# Patient Record
Sex: Male | Born: 1970
Health system: Southern US, Community
[De-identification: ages and names within clinical notes are randomized; demographics above are authoritative.]

## PROBLEM LIST (undated history)

## (undated) DIAGNOSIS — E119 Type 2 diabetes mellitus without complications: Secondary | ICD-10-CM

## (undated) DIAGNOSIS — I10 Essential (primary) hypertension: Secondary | ICD-10-CM

## (undated) DIAGNOSIS — M1990 Unspecified osteoarthritis, unspecified site: Secondary | ICD-10-CM

## (undated) HISTORY — DX: Unspecified osteoarthritis, unspecified site: M19.90

## (undated) HISTORY — PX: NO PAST SURGERIES: SHX2092

---

## 2001-03-10 ENCOUNTER — Emergency Department (HOSPITAL_COMMUNITY): Admission: EM | Admit: 2001-03-10 | Discharge: 2001-03-10 | Payer: Self-pay | Admitting: Emergency Medicine

## 2001-03-10 ENCOUNTER — Encounter: Payer: Self-pay | Admitting: Emergency Medicine

## 2001-07-12 ENCOUNTER — Emergency Department (HOSPITAL_COMMUNITY): Admission: EM | Admit: 2001-07-12 | Discharge: 2001-07-12 | Payer: Self-pay | Admitting: Emergency Medicine

## 2001-07-12 ENCOUNTER — Encounter: Payer: Self-pay | Admitting: Emergency Medicine

## 2001-07-19 ENCOUNTER — Emergency Department (HOSPITAL_COMMUNITY): Admission: EM | Admit: 2001-07-19 | Discharge: 2001-07-19 | Payer: Self-pay | Admitting: Emergency Medicine

## 2003-03-04 ENCOUNTER — Emergency Department (HOSPITAL_COMMUNITY): Admission: EM | Admit: 2003-03-04 | Discharge: 2003-03-04 | Payer: Self-pay | Admitting: Emergency Medicine

## 2005-04-26 ENCOUNTER — Emergency Department (HOSPITAL_COMMUNITY): Admission: EM | Admit: 2005-04-26 | Discharge: 2005-04-26 | Payer: Self-pay | Admitting: Emergency Medicine

## 2009-10-13 ENCOUNTER — Emergency Department (HOSPITAL_COMMUNITY): Admission: EM | Admit: 2009-10-13 | Discharge: 2009-10-13 | Payer: Self-pay | Admitting: Emergency Medicine

## 2012-03-13 ENCOUNTER — Emergency Department (HOSPITAL_COMMUNITY)
Admission: EM | Admit: 2012-03-13 | Discharge: 2012-03-13 | Disposition: A | Discharge status: Home / Self Care | Payer: Self-pay | Attending: Emergency Medicine | Admitting: Emergency Medicine

## 2012-03-13 ENCOUNTER — Encounter (HOSPITAL_COMMUNITY): Payer: Self-pay | Admitting: Emergency Medicine

## 2012-03-13 DIAGNOSIS — S01511D Laceration without foreign body of lip, subsequent encounter: Secondary | ICD-10-CM

## 2012-03-13 DIAGNOSIS — T07XXXA Unspecified multiple injuries, initial encounter: Secondary | ICD-10-CM

## 2012-03-13 DIAGNOSIS — Y92410 Unspecified street and highway as the place of occurrence of the external cause: Secondary | ICD-10-CM | POA: Insufficient documentation

## 2012-03-13 DIAGNOSIS — F17200 Nicotine dependence, unspecified, uncomplicated: Secondary | ICD-10-CM | POA: Insufficient documentation

## 2012-03-13 DIAGNOSIS — S01501A Unspecified open wound of lip, initial encounter: Secondary | ICD-10-CM | POA: Insufficient documentation

## 2012-03-13 HISTORY — DX: Essential (primary) hypertension: I10

## 2012-03-13 MED ORDER — TETANUS-DIPHTH-ACELL PERTUSSIS 5-2.5-18.5 LF-MCG/0.5 IM SUSP
0.5000 mL | Freq: Once | INTRAMUSCULAR | Status: AC
  Administered 2012-03-13: 0.5 mL via INTRAMUSCULAR
  Filled 2012-03-13: qty 0.5

## 2012-03-13 MED ORDER — IBUPROFEN 800 MG PO TABS: 800.0000 mg | ORAL_TABLET | Freq: Three times a day (TID) | ORAL | Status: AC | PRN

## 2012-03-13 NOTE — ED Notes (Signed)
Pt was given [facility]. Pt must find PCP due to intermittent hypertension
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
## 2012-03-13 NOTE — Discharge Instructions (Signed)
Return here as needed. Keep the area on your lip clean. Ice on your lip as well.
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
## 2012-03-13 NOTE — ED Provider Notes (Signed)
Medical screening examination/treatment/procedure(s) were performed by non-physician practitioner and as supervising physician I was immediately available for consultation/collaboration.   Joel Mericle M Rudi Bunyard, MD 03/13/12 1700 

## 2012-06-27 ENCOUNTER — Emergency Department (HOSPITAL_COMMUNITY): Payer: Self-pay

## 2012-06-27 ENCOUNTER — Encounter (HOSPITAL_COMMUNITY): Payer: Self-pay

## 2012-06-27 ENCOUNTER — Emergency Department (HOSPITAL_COMMUNITY)
Admission: EM | Admit: 2012-06-27 | Discharge: 2012-06-27 | Disposition: A | Discharge status: Home / Self Care | Payer: Self-pay | Attending: Emergency Medicine | Admitting: Emergency Medicine

## 2012-06-27 DIAGNOSIS — R079 Chest pain, unspecified: Secondary | ICD-10-CM | POA: Insufficient documentation

## 2012-06-27 DIAGNOSIS — I10 Essential (primary) hypertension: Secondary | ICD-10-CM | POA: Insufficient documentation

## 2012-06-27 DIAGNOSIS — R0602 Shortness of breath: Secondary | ICD-10-CM | POA: Insufficient documentation

## 2012-06-27 LAB — CBC
HCT: 45.4 % (ref 39.0–52.0)
Hemoglobin: 15.1 g/dL (ref 13.0–17.0)
MCH: 28.9 pg (ref 26.0–34.0)
MCHC: 33.3 g/dL (ref 30.0–36.0)

## 2012-06-27 LAB — BASIC METABOLIC PANEL
BUN: 8 mg/dL (ref 6–23)
GFR calc non Af Amer: >90 mL/min (ref >90)
Glucose, Bld: 127 mg/dL — ABNORMAL HIGH (ref 70–99)
Potassium: 4 mEq/L (ref 3.5–5.1)

## 2012-06-27 LAB — TROPONIN I
Troponin I: <0.3 ng/mL (ref <0.30)
Troponin I: <0.3 ng/mL (ref <0.30)

## 2012-06-27 MED ORDER — ALBUTEROL SULFATE HFA 108 (90 BASE) MCG/ACT IN AERS
2.0000 | INHALATION_SPRAY | RESPIRATORY_TRACT | Status: DC | PRN
  Filled 2012-06-27: qty 6.7

## 2012-06-27 MED ORDER — MORPHINE SULFATE 4 MG/ML IJ SOLN
4.0000 mg | Freq: Once | INTRAMUSCULAR | Status: AC
  Administered 2012-06-27: 4 mg via INTRAVENOUS
  Filled 2012-06-27: qty 1

## 2012-06-27 NOTE — ED Notes (Signed)
MD at bedside. 

## 2012-06-27 NOTE — ED Notes (Signed)
Per EMS, pt started having chest pain about a week ago.  Pt is c/o mid-sternal chest pain with no radiation.  Pt rates pain 7/10, 'sharp pain'.

## 2012-06-27 NOTE — ED Provider Notes (Signed)
History     CSN: [id number]  Arrival date & time 06/27/12  1554   First MD Initiated Contact with Patient 06/27/12 1603      Chief Complaint  Patient presents with  . Chest Pain    (Consider location/radiation/quality/duration/timing/severity/associated sxs/prior treatment) Patient is a 41 y.o. male presenting with chest pain. The history is provided by the patient.  Chest Pain The chest pain began 5 - 7 days ago. Chest pain occurs constantly. The chest pain is unchanged. The severity of the pain is moderate. The quality of the pain is described as sharp. The pain does not radiate. Chest pain is worsened by certain positions. Primary symptoms include shortness of breath. Pertinent negatives for primary symptoms include no fever, no fatigue, no syncope, no cough, no wheezing, no palpitations, no abdominal pain, no nausea, no vomiting, no dizziness and no altered mental status. He tried nothing for the symptoms. Risk factors include male gender, obesity and smoking/tobacco exposure.  His past medical history is significant for hypertension.     Past Medical History  Diagnosis Date  . Hypertension     History reviewed. No pertinent past surgical history.  Family History  Problem Relation Age of Onset  . Diabetes Mother   . Hypertension Mother     History  Substance Use Topics  . Smoking status: Current Every Day Smoker    Types: Cigarettes  . Smokeless tobacco: Not on file  . Alcohol Use: Yes     socially      Review of Systems  Constitutional: Negative for fever and fatigue.  Respiratory: Positive for shortness of breath. Negative for cough and wheezing.   Cardiovascular: Positive for chest pain. Negative for palpitations and syncope.  Gastrointestinal: Negative for nausea, vomiting, abdominal pain and diarrhea.  Neurological: Negative for dizziness and headaches.  Psychiatric/Behavioral: Negative for altered mental status.  All other systems reviewed and are  negative.    Allergies  Review of patient's allergies indicates no known allergies.  Home Medications  No current outpatient prescriptions on file.  BP 145/72  Pulse 84  Temp 97.4 F (36.3 C) (Oral)  Resp 19  Ht 5\' 7"  (1.702 m)  Wt 207 lb (93.895 kg)  BMI 32.42 kg/m2  SpO2 97%  Physical Exam  Nursing note and vitals reviewed. Constitutional: He is oriented to person, place, and time. He appears well-developed and well-nourished. No distress.  HENT:  Head: Normocephalic and atraumatic.  Eyes: Pupils are equal, round, and reactive to light.  Cardiovascular: Normal rate, regular rhythm and normal heart sounds.  Exam reveals no gallop and no friction rub.   No murmur heard. Pulmonary/Chest: Effort normal and breath sounds normal. No respiratory distress.  Abdominal: Soft. He exhibits no distension. There is no tenderness.  Musculoskeletal: Normal range of motion.  Neurological: He is alert and oriented to person, place, and time.  Skin: Skin is warm and dry.  Psychiatric: He has a normal mood and affect.    ED Course  Procedures (including critical care time)  Labs Reviewed  BASIC METABOLIC PANEL - Abnormal; Notable for the following:    Glucose, Bld 127 (*)     All other components within normal limits  TROPONIN I  CBC  D-DIMER, QUANTITATIVE  SEDIMENTATION RATE  TROPONIN I   Dg Chest 2 View  06/27/2012  *RADIOLOGY REPORT*  Clinical Data: Chest pain.  Hypertension.  CHEST - 2 VIEW  Comparison:  None.  Findings:  The heart size and mediastinal contours are within  normal limits.  Both lungs are clear.  The visualized skeletal structures are unremarkable.  IMPRESSION: No active cardiopulmonary disease.   Original Report Authenticated By: Danae Orleans, M.D.      1. Chest pain       Date: 06/27/2012  Rate: 86  Rhythm: normal sinus rhythm  QRS Axis: normal  Intervals: normal  ST/T Wave abnormalities: diffuse T-wave inversion (I, II, aVF, V3-V6)  Conduction  Disutrbances:none  Narrative Interpretation: Diffuse non-specific T wave inversions  Old EKG Reviewed: none available    MDM  4:42 PM Pt seen and examined. Pt with about a week of sharp SS chest pain associated with SOB. Pt with HTN and is a smoker. Due to EKG, will get troponin. Pain does not sound ischemic as it is worst while sitting on the bed. Will also get D-dimer.    8:14 PM Have not yet been able to reach cardiology. Will send second troponin and ESR for possible pericarditis due to diffuse nature of T wave inversions. Labs unremarkable. Pain decreased with morphine.   10:15 PM Spoke to [facility] who will see patient in office tomorrow for new EKG findings. Second troponin also negative. Feel that as patient has mostly been constant for a week, doubt ischemic cause. Patient did get some relief with morphine. Will also send with inhaler due to smoking history.   Daleen Bo, MD 06/28/12 [phone]

## 2012-06-28 NOTE — ED Provider Notes (Signed)
I saw and evaluated the patient, reviewed the resident's note and I agree with the findings and plan. I evaluated the EKG and agree with the resident's interpretation. Patient is complaining of one-week consistent chest pain that is pleuritic in nature. He states he did have a URI and viral symptoms a week prior to symptoms starting. If any shortness of breath currently but states some of the chest pain is still present. His EKG shows diffuse T wave inversion with negative troponins x2, normal d-dimer and a normal chest x-ray. Patient is a smoker but otherwise has no medical history. Spoke with cardiology who will followup with the patient tomorrow as this could be pericarditis the patient also was given an inhaler with some improvement.  Gwyneth Sprout, MD 06/28/12 2326

## 2012-06-30 ENCOUNTER — Telehealth (HOSPITAL_COMMUNITY): Payer: Self-pay | Admitting: *Deleted

## 2012-06-30 ENCOUNTER — Inpatient Hospital Stay (HOSPITAL_COMMUNITY): Payer: Self-pay

## 2012-06-30 NOTE — Telephone Encounter (Signed)
Dr Gala Romney was consulted on pt when he was in the ER on 10/1 with chest pain and advised pt to f/u as out patient, pt was contacted by phone on 10/3 and appointment was scheduled for him to see Dr Gala Romney on 10/4 at 10 am he agreed to this time and was given directions to the clinic, however he did not show up for the appointment today

## 2012-12-02 ENCOUNTER — Emergency Department (HOSPITAL_COMMUNITY): Payer: Self-pay

## 2012-12-02 ENCOUNTER — Encounter (HOSPITAL_COMMUNITY): Payer: Self-pay | Admitting: Emergency Medicine

## 2012-12-02 ENCOUNTER — Emergency Department (HOSPITAL_COMMUNITY)
Admission: EM | Admit: 2012-12-02 | Discharge: 2012-12-03 | Disposition: A | Discharge status: Home / Self Care | Payer: Self-pay | Attending: Emergency Medicine | Admitting: Emergency Medicine

## 2012-12-02 DIAGNOSIS — R0789 Other chest pain: Secondary | ICD-10-CM | POA: Insufficient documentation

## 2012-12-02 DIAGNOSIS — F17200 Nicotine dependence, unspecified, uncomplicated: Secondary | ICD-10-CM | POA: Insufficient documentation

## 2012-12-02 DIAGNOSIS — I10 Essential (primary) hypertension: Secondary | ICD-10-CM | POA: Insufficient documentation

## 2012-12-02 NOTE — ED Notes (Signed)
Pt alert, arrives from home, c/o left sided chest pain, onset was today, denies onset, describes pain as sharp radiating to left arm, resp even unlabored, skin pwd

## 2012-12-03 LAB — CBC
HCT: 44.1 % (ref 39.0–52.0)
Hemoglobin: 14.7 g/dL (ref 13.0–17.0)
MCHC: 33.3 g/dL (ref 30.0–36.0)
RBC: 5 MIL/uL (ref 4.22–5.81)

## 2012-12-03 LAB — BASIC METABOLIC PANEL
BUN: 15 mg/dL (ref 6–23)
CO2: 25 mEq/L (ref 19–32)
Chloride: 105 mEq/L (ref 96–112)
GFR calc non Af Amer: >90 mL/min (ref >90)
Glucose, Bld: 108 mg/dL — ABNORMAL HIGH (ref 70–99)
Potassium: 3.9 mEq/L (ref 3.5–5.1)

## 2012-12-03 NOTE — ED Notes (Signed)
Attempted to start IV x 3 RNs. IV Team paged and is at the bedside.

## 2012-12-03 NOTE — ED Provider Notes (Addendum)
History     CSN: [id number]  Arrival date & time 12/02/12  2208   First MD Initiated Contact with Patient 12/03/12 [phone]      Chief Complaint  Patient presents with  . Chest Pain    (Consider location/radiation/quality/duration/timing/severity/associated sxs/prior treatment) HPI This is a 42 year old male with history of hypertension. He has been having chest pain since yesterday about noon. The pain is described as a sharp, well localized pain just above the left nipple. It only last for one to 2 seconds. It is moderate in intensity. There does not seem to be any triggering factors. He is also having some lesser discomfort in his left submaxillary area, worse with deep breathing or raising his left arm. He denies dyspnea, diaphoresis, nausea and vomiting.  Past Medical History  Diagnosis Date  . Hypertension     History reviewed. No pertinent past surgical history.  Family History  Problem Relation Age of Onset  . Diabetes Mother   . Hypertension Mother     History  Substance Use Topics  . Smoking status: Current Every Day Smoker    Types: Cigarettes  . Smokeless tobacco: Not on file  . Alcohol Use: Yes     Comment: socially      Review of Systems  All other systems reviewed and are negative.    Allergies  Review of patient's allergies indicates no known allergies.  Home Medications  No current outpatient prescriptions on file.  BP 135/80  Pulse 59  Temp(Src) 97.9 F (36.6 C) (Oral)  Resp 16  Wt 220 lb (99.791 kg)  BMI 34.45 kg/m2  SpO2 99%  Physical Exam General: Well-developed, well-nourished male in no acute distress; appearance consistent with age of record HENT: normocephalic, atraumatic Eyes: pupils equal round and reactive to light; extraocular muscles intact Neck: supple Heart: regular rate and rhythm; no murmurs, rubs or gallops Lungs: clear to auscultation bilaterally Chest: Nontender Abdomen: soft; nondistended Extremities: No  deformity; full range of motion; pulses normal Neurologic: Awake, alert and oriented; motor function intact in all extremities and symmetric; no facial droop Skin: Warm and dry Psychiatric: Normal mood and affect    ED Course  Procedures (including critical care time)     MDM   Nursing notes and vitals signs, including pulse oximetry, reviewed.  Summary of this visit's results, reviewed by myself:  Labs:  Results for orders placed during the hospital encounter of 12/02/12 (from the past 24 hour(s))  CBC     Status: None   Collection Time    12/02/12 11:55 PM      Result Value Range   WBC 5.3  4.0 - 10.5 K/uL   RBC 5.00  4.22 - 5.81 MIL/uL   Hemoglobin 14.7  13.0 - 17.0 g/dL   HCT 11.9  14.7 - 82.9 %   MCV 88.2  78.0 - 100.0 fL   MCH 29.4  26.0 - 34.0 pg   MCHC 33.3  30.0 - 36.0 g/dL   RDW 56.2  13.0 - 86.5 %   Platelets 247  150 - 400 K/uL  BASIC METABOLIC PANEL     Status: Abnormal   Collection Time    12/02/12 11:55 PM      Result Value Range   Sodium 139  135 - 145 mEq/L   Potassium 3.9  3.5 - 5.1 mEq/L   Chloride 105  96 - 112 mEq/L   CO2 25  19 - 32 mEq/L   Glucose, Bld 108 (*) 70 -  99 mg/dL   BUN 15  6 - 23 mg/dL   Creatinine, Ser 1.61  0.50 - 1.35 mg/dL   Calcium 8.5  8.4 - 09.6 mg/dL   GFR calc non Af Amer >90  >90 mL/min   GFR calc Af Amer >90  >90 mL/min  TROPONIN I     Status: None   Collection Time    12/02/12 11:55 PM      Result Value Range   Troponin I <0.30  <0.30 ng/mL  TROPONIN I     Status: None   Collection Time    12/03/12  6:02 AM      Result Value Range   Troponin I <0.30  <0.30 ng/mL    Imaging Studies: Dg Chest 2 View  12/02/2012  *RADIOLOGY REPORT*  Clinical Data: Left mid chest pain, radiating to the left axilla. History of smoking.  CHEST - 2 VIEW  Comparison: Chest radiograph performed 06/27/2012  Findings: The lungs are well-aerated and clear.  There is no evidence of focal opacification, pleural effusion or pneumothorax.  The  heart is normal in size; the mediastinal contour is within normal limits.  No acute osseous abnormalities are seen.  IMPRESSION: No acute cardiopulmonary process seen.   Original Report Authenticated By: Tonia Ghent, M.D.    EKG Interpretation:  Date & Time: 12/02/2012 10:19 PM  Rate: 73  Rhythm: normal sinus rhythm  QRS Axis: normal  Intervals: normal  ST/T Wave abnormalities: Diffuse T-wave inversions  Conduction Disutrbances:none  Narrative Interpretation: Poor R-wave progression  Old EKG Reviewed: Unchanged from 06/27/2012  6:47 AM The patient's pain is atypical for coronary pain is only lasts for several seconds and is sharp. The pain under his left axilla is consistent with chest wall pain due to its variability with deep breathing and movement of the left arm.          Hanley Seamen, MD 12/03/12 0454  Hanley Seamen, MD 12/03/12 [phone]

## 2013-05-07 ENCOUNTER — Encounter (HOSPITAL_COMMUNITY): Payer: Self-pay | Admitting: Emergency Medicine

## 2013-05-07 ENCOUNTER — Emergency Department (HOSPITAL_COMMUNITY)
Admission: EM | Admit: 2013-05-07 | Discharge: 2013-05-07 | Disposition: A | Discharge status: Home / Self Care | Payer: Self-pay | Attending: Emergency Medicine | Admitting: Emergency Medicine

## 2013-05-07 DIAGNOSIS — I10 Essential (primary) hypertension: Secondary | ICD-10-CM | POA: Insufficient documentation

## 2013-05-07 DIAGNOSIS — Z0000 Encounter for general adult medical examination without abnormal findings: Secondary | ICD-10-CM

## 2013-05-07 DIAGNOSIS — Z711 Person with feared health complaint in whom no diagnosis is made: Secondary | ICD-10-CM | POA: Insufficient documentation

## 2013-05-07 DIAGNOSIS — F17200 Nicotine dependence, unspecified, uncomplicated: Secondary | ICD-10-CM | POA: Insufficient documentation

## 2013-05-07 LAB — URINALYSIS, ROUTINE W REFLEX MICROSCOPIC
Glucose, UA: NEGATIVE mg/dL
Leukocytes, UA: NEGATIVE
Nitrite: NEGATIVE
Specific Gravity, Urine: 1.023 (ref 1.005–1.030)
pH: 6.5 (ref 5.0–8.0)

## 2013-05-07 NOTE — ED Notes (Signed)
Pt states that his urine has been cloudy and with some blood this am, denies any pain. Some intermit lower back pain.

## 2013-05-07 NOTE — Progress Notes (Signed)
[facility] provided patient with a [facility] application.  °

## 2013-05-07 NOTE — ED Provider Notes (Signed)
Medical screening examination/treatment/procedure(s) were performed by non-physician practitioner and as supervising physician I was immediately available for consultation/collaboration.   Gwyneth Sprout, MD 05/07/13 2154

## 2013-05-07 NOTE — ED Provider Notes (Signed)
  CSN: [id number]     Arrival date & time 05/07/13  1324 History     First MD Initiated Contact with Patient 05/07/13 [phone]     Chief Complaint  Patient presents with  . Hematuria   (Consider location/radiation/quality/duration/timing/severity/associated sxs/prior Treatment) HPI  Robert Pratt is a 42 y.o. male with past medical history significant for hypertension and he is a daily smoker complaining of 2 episodes of painless hematuria this a.m. No history of kidney stones, patient denies any dysuria, urinary frequency, testicular pain swelling, lesions, abdominal pain, denies easy bleeding/bruising, weight loss, night sweats. Patient had similar prior episode several years ago. Patient states the bloody so this morning is now resolved.  Past Medical History  Diagnosis Date  . Hypertension    History reviewed. No pertinent past surgical history. Family History  Problem Relation Age of Onset  . Diabetes Mother   . Hypertension Mother    History  Substance Use Topics  . Smoking status: Current Every Day Smoker    Types: Cigarettes  . Smokeless tobacco: Not on file  . Alcohol Use: Yes     Comment: socially    Review of Systems 10 systems reviewed and found to be negative, except as noted in the HPI   Allergies  Review of patient's allergies indicates no known allergies.  Home Medications  No current outpatient prescriptions on file. BP 159/83  Pulse 66  Temp(Src) 98 F (36.7 C)  Resp 20  SpO2 99% Physical Exam  Nursing note and vitals reviewed. Constitutional: He is oriented to person, place, and time. He appears well-developed and well-nourished. No distress.  HENT:  Head: Normocephalic.  Eyes: Conjunctivae and EOM are normal.  Cardiovascular: Normal rate and intact distal pulses.   Pulmonary/Chest: Effort normal and breath sounds normal. No stridor.  Abdominal: Soft. Bowel sounds are normal. He exhibits no mass. There is no tenderness. There is no rebound and no  guarding.  Genitourinary:  No CVA tenderness bilaterally  Musculoskeletal: Normal range of motion.  Neurological: He is alert and oriented to person, place, and time.  Psychiatric: He has a normal mood and affect.    ED Course   Procedures (including critical care time)  Labs Reviewed  URINALYSIS, ROUTINE W REFLEX MICROSCOPIC   No results found. 1. Normal physical exam     MDM   Filed Vitals:   05/07/13 0941  BP: 159/83  Pulse: 66  Temp: 98 F (36.7 C)  Resp: 20  SpO2: 99%     Robert Pratt is a 42 y.o. male with 2 episodes of painless hematuria this a.m., now resolved. Urinalysis today shows no abnormalities. I have asked the patient to followup for a recheck before every meal there at [facility] urgent care or in the ED in the next 2-4 weeks. Return cautions discussed.  Pt is hemodynamically stable, appropriate for, and amenable to discharge at this time. Pt verbalized understanding and agrees with care plan. All questions answered. Outpatient follow-up and specific return precautions discussed.    Note: Portions of this report may have been transcribed using voice recognition software. Every effort was made to ensure accuracy; however, inadvertent computerized transcription errors may be present    Wynetta Emery, PA-C 05/07/13 1024

## 2013-08-31 ENCOUNTER — Emergency Department (HOSPITAL_COMMUNITY)
Admission: EM | Admit: 2013-08-31 | Discharge: 2013-08-31 | Disposition: A | Discharge status: Home / Self Care | Payer: Self-pay | Attending: Emergency Medicine | Admitting: Emergency Medicine

## 2013-08-31 ENCOUNTER — Encounter (HOSPITAL_COMMUNITY): Payer: Self-pay | Admitting: Emergency Medicine

## 2013-08-31 ENCOUNTER — Emergency Department (HOSPITAL_COMMUNITY): Payer: Self-pay

## 2013-08-31 DIAGNOSIS — M25569 Pain in unspecified knee: Secondary | ICD-10-CM | POA: Insufficient documentation

## 2013-08-31 DIAGNOSIS — M25469 Effusion, unspecified knee: Secondary | ICD-10-CM | POA: Insufficient documentation

## 2013-08-31 DIAGNOSIS — I10 Essential (primary) hypertension: Secondary | ICD-10-CM | POA: Insufficient documentation

## 2013-08-31 DIAGNOSIS — M13161 Monoarthritis, not elsewhere classified, right knee: Secondary | ICD-10-CM

## 2013-08-31 DIAGNOSIS — Z791 Long term (current) use of non-steroidal anti-inflammatories (NSAID): Secondary | ICD-10-CM | POA: Insufficient documentation

## 2013-08-31 DIAGNOSIS — F17200 Nicotine dependence, unspecified, uncomplicated: Secondary | ICD-10-CM | POA: Insufficient documentation

## 2013-08-31 LAB — SYNOVIAL CELL COUNT + DIFF, W/ CRYSTALS
Neutrophil, Synovial: 27 % — ABNORMAL HIGH (ref 0–25)
WBC, Synovial: 839 /mm3 — ABNORMAL HIGH (ref 0–200)

## 2013-08-31 LAB — GRAM STAIN

## 2013-08-31 LAB — GLUCOSE, SYNOVIAL FLUID: Glucose, Synovial Fluid: 100 mg/dL

## 2013-08-31 MED ORDER — LIDOCAINE HCL 2 % IJ SOLN
INTRAMUSCULAR | Status: AC
  Filled 2013-08-31: qty 20

## 2013-08-31 MED ORDER — OXYCODONE-ACETAMINOPHEN 5-325 MG PO TABS
1.0000 | ORAL_TABLET | Freq: Once | ORAL | Status: DC
  Filled 2013-08-31: qty 1

## 2013-08-31 MED ORDER — NAPROXEN 500 MG PO TABS: 500.0000 mg | ORAL_TABLET | Freq: Two times a day (BID) | ORAL | Status: DC

## 2013-08-31 NOTE — ED Notes (Addendum)
Pt c/o increasing R upper leg pain and swelling x days.  Pain score 10/10.  Denies injury.  No redness or warmth noted.  Pt ambulated into department w/o difficulty.

## 2013-08-31 NOTE — Progress Notes (Signed)
[facility] provided pt with a list of primary care resources. Patient stated that he was pending insurance through job.  °

## 2013-08-31 NOTE — ED Provider Notes (Signed)
CSN: [id number]     Arrival date & time 08/31/13  3086 History   First MD Initiated Contact with Patient 08/31/13 0932     Chief Complaint  Patient presents with  . Leg Pain  . Leg Swelling   (Consider location/radiation/quality/duration/timing/severity/associated sxs/prior Treatment) The history is provided by the patient. No language interpreter was used.  Robert Pratt is a 42 y/o M with PMHx of HTN presenting to the ED with right knee pain and swelling that has been ongoing since Monday that has gradually gotten worse. Patient reported that he has noticed swelling to the right knee that has gotten gradually worse. Patient reported that when he is walking and applying pressure to the leg there is a sharp, shooting pain. Patient reported that while at rest there is a tingling sensation. Patient reported that the pain radiates up right thigh and down to the mid-calf. Patient reported that nothing makes the pain better. Patient reported that he has not used anything for pain. Denied falls, injury, numbness, tingling, loss of sensation, weakness, chills, fever. Patient denied history of DM and STDs.  PCP none   Past Medical History  Diagnosis Date  . Hypertension    History reviewed. No pertinent past surgical history. Family History  Problem Relation Age of Onset  . Diabetes Mother   . Hypertension Mother    History  Substance Use Topics  . Smoking status: Current Every Day Smoker -- 0.50 packs/day    Types: Cigarettes  . Smokeless tobacco: Never Used  . Alcohol Use: No    Review of Systems  Constitutional: Negative for fever and chills.  Musculoskeletal: Positive for arthralgias (right knee).  Skin: Negative for color change and rash.  Neurological: Negative for weakness and numbness.  All other systems reviewed and are negative.    Allergies  Review of patient's allergies indicates no known allergies.  Home Medications   Current Outpatient Rx  Name  Route  Sig   Dispense  Refill  . naproxen (NAPROSYN) 500 MG tablet   Oral   Take 1 tablet (500 mg total) by mouth 2 (two) times daily.   30 tablet   0    BP 144/95  Pulse 67  Temp(Src) 97.7 F (36.5 C) (Oral)  Resp 12  SpO2 100% Physical Exam  Nursing note and vitals reviewed. Constitutional: He is oriented to person, place, and time. He appears well-developed and well-nourished. No distress.  HENT:  Head: Normocephalic and atraumatic.  Eyes: Conjunctivae and EOM are normal. Pupils are equal, round, and reactive to light. Right eye exhibits no discharge. Left eye exhibits no discharge.  Neck: Normal range of motion. Neck supple. No tracheal deviation present.  Cardiovascular: Normal rate, regular rhythm and normal heart sounds.  Exam reveals no friction rub.   No murmur heard. Pulses:      Radial pulses are 2+ on the right side, and 2+ on the left side.       Dorsalis pedis pulses are 2+ on the right side, and 2+ on the left side.  Pulmonary/Chest: Effort normal and breath sounds normal. No respiratory distress. He has no wheezes. He has no rales.  Musculoskeletal: He exhibits tenderness.  Moderate swelling localized to the right knee with mild warmth upon palpation. Mild decrease in flexion and extension of the right knee secondary to pain - but, motion noted to the right knee. Discomfort upon palpation to the right knee, circumferential.  Full ROM to right ankle, right hip, digits of the  right foot, and LLE.   Lymphadenopathy:    He has no cervical adenopathy.  Neurological: He is alert and oriented to person, place, and time. He exhibits normal muscle tone. Coordination normal.  Strength 5+/5+ to lower extremities bilaterally with resistance applied, equal distribution noted Sensation intact with differentiation to sharp and dull touch to BLE  Skin: Skin is warm and dry. He is not diaphoretic. No erythema.  Negative erythema and inflammation noted.  Negative lesions noted to the palms of  the hands and soles of the feet.   Psychiatric: He has a normal mood and affect. His behavior is normal. Thought content normal.    ED Course  Procedures (including critical care time)  11:29 AM This provider assisted in tapping the right knee with Dr. Cheri Rous. Approximately 100-105 cc of fluid withdrawn from the right knee. Improved mobility of the right knee noted. Pressure dressing applied. Body fluid labs sent off and pending.   Results for orders placed during the hospital encounter of 08/31/13  GRAM STAIN      Result Value Range   Specimen Description SYNOVIAL RIGHT KNEE     Special Requests Normal     Gram Stain       Value: RARE WBC PRESENT, PREDOMINANTLY PMN     NO ORGANISMS SEEN     Gram Stain Report Called to,Read Back By and Verified With: CARNEAL,C. AT 1330 ON 12.05.14 BY LOVE,T.   Report Status 08/31/2013 FINAL    SYNOVIAL CELL COUNT + DIFF, W/ CRYSTALS      Result Value Range   Color, Synovial YELLOW  YELLOW   Appearance-Synovial CLOUDY (*) CLEAR   Crystals, Fluid NO CRYSTALS SEEN     WBC, Synovial 839 (*) 0 - 200 /cu mm   Neutrophil, Synovial 27 (*) 0 - 25 %   Lymphocytes-Synovial Fld 71 (*) 0 - 20 %   Monocyte-Macrophage-Synovial Fluid 2 (*) 50 - 90 %     Dg Knee Complete 4 Views Right  08/31/2013   CLINICAL DATA:  right lateral knee pain.  EXAM: RIGHT KNEE - COMPLETE 4+ VIEW  COMPARISON:  None.  FINDINGS: Normal alignment without acute fracture. No significant arthropathy. Bipartite patella noted. Soft tissue swelling evident with a large joint effusion on the lateral view.  IMPRESSION: Soft tissue swelling with a large right knee joint effusion  No acute osseous finding  Bipartite patellar configuration.   Electronically Signed   By: Ruel Favors M.D.   On: 08/31/2013 10:33   Labs Review Labs Reviewed  SYNOVIAL CELL COUNT + DIFF, W/ CRYSTALS - Abnormal; Notable for the following:    Appearance-Synovial CLOUDY (*)    WBC, Synovial 839 (*)    Neutrophil,  Synovial 27 (*)    Lymphocytes-Synovial Fld 71 (*)    Monocyte-Macrophage-Synovial Fluid 2 (*)    All other components within normal limits  GRAM STAIN  PROTEIN, BODY FLUID  GLUCOSE, SYNOVIAL FLUID  BODY FLUID CRYSTAL   Imaging Review Dg Knee Complete 4 Views Right  08/31/2013   CLINICAL DATA:  right lateral knee pain.  EXAM: RIGHT KNEE - COMPLETE 4+ VIEW  COMPARISON:  None.  FINDINGS: Normal alignment without acute fracture. No significant arthropathy. Bipartite patella noted. Soft tissue swelling evident with a large joint effusion on the lateral view.  IMPRESSION: Soft tissue swelling with a large right knee joint effusion  No acute osseous finding  Bipartite patellar configuration.   Electronically Signed   By: Sharen Counter.D.  On: 08/31/2013 10:33    EKG Interpretation   None       MDM   1. Inflammation of joint of knee, right     Medications  lidocaine (XYLOCAINE) 2 % (with pres) injection (not administered)  oxyCODONE-acetaminophen (PERCOCET/ROXICET) 5-325 MG per tablet 1 tablet (1 tablet Oral Not Given 08/31/13 1313)   Filed Vitals:   08/31/13 0830 08/31/13 1311  BP: 169/88 144/95  Pulse: 76 67  Temp: 98.1 F (36.7 C) 97.7 F (36.5 C)  TempSrc: Oral Oral  Resp: 18 12  SpO2: 98% 100%     Patient presenting to the ED with right knee swelling and pain that has been ongoing since Monday.  Alert and oriented. GCS 15. Heart rate and rhythm normal. Pulses palpable and strong, radial and DP 2+ bilaterally. Moderate swelling localized to the right knee with mild warmth upon palpation, decreased flexion and extension noted secondary to pain. Circumferential discomfort noted upon palpation. Strength intact to BLE with resistance applied, equal distribution noted.  Right knee plain film performed with soft tissue swelling noted with large right knee joint effusion identified. This provider and Dr. Cheri Rous performed joint tap and sent out for analysis the specimens of  synovial fluid. Synovial fluid noted to be cloudy in appearance with mild elevated WBC of 839, mild elevated neutrophils and lymphocytes of 71. Negative findings on the gram stain. Protein and glucose pending. Discussed case and reviewed labs and synovial fluid findings with Dr. Cheri Rous - as per physician reported that this is more of an inflammatory process, recommended patient be discharged with anti-inflammatories and follow-up.  Doubt septic doubt. Negative crystals found - doubt gout. Suspicion to be possible bursitis leading to inflammatory process. Patient stable, afebrile. Patient does not appear toxic. Discharged patient. Discharged patient with anti-inflammatories. Referred patient to orthopedics. Discussed with patient to rest, ice, elevate. Discharged patient with knee sleeve. Work note given. Discussed with patient to continue to monitor symptoms closely and if symptoms are to worsen or change to report back to the ED - strict return instructions given.  Patient agreed to plan of care, understood, all questions answered.   Raymon Mutton, PA-C 08/31/13 [phone]

## 2013-09-03 NOTE — ED Provider Notes (Signed)
Medical screening examination/treatment/procedure(s) were conducted as a shared visit with non-physician practitioner(s) and myself.  I personally evaluated the patient during the encounter.  EKG Interpretation   None       Robert Pratt is a 42 y.o. male hx of HTN here with R knee pain and swelling. He is on his feet a lot and bends down a lot at work. Knee swelling for several days. Obvious effusion on exam. No other signs of septic knee. No history of gout. Xray showed large effusion. I performed arthrocentesis and drawn out 100 cc fluid. Compression dressing placed. Fluid showed small number of WBC and no crystals. I think he likely has joint effusion and I doubt any infection or gout. Will d/c home on anti inflammatories.   ARTHOCENTESIS Performed by: Chaney Malling Consent: Verbal consent obtained. Risks and benefits: risks, benefits and alternatives were discussed Consent given by: patient Required items: required blood products, implants, devices, and special equipment available Patient identity confirmed: verbally with patient Time out: Immediately prior to procedure a "time out" was called to verify the correct patient, procedure, equipment, support staff and site/side marked as required. Indications: R knee swelling Joint: R knee Local anesthesia used: 5 cc 2% lido with no epi Preparation: Patient was prepped and draped in the usual sterile fashion. Aspirate appearance: clear Aspirate amount: 100 ml Patient tolerance: Patient tolerated the procedure well with no immediate complications. Compression dressing placed.       Richardean Canal, MD 09/03/13 [phone]

## 2013-11-03 ENCOUNTER — Emergency Department (HOSPITAL_COMMUNITY)
Admission: EM | Admit: 2013-11-03 | Discharge: 2013-11-03 | Disposition: A | Discharge status: Home / Self Care | Payer: Self-pay | Attending: Emergency Medicine | Admitting: Emergency Medicine

## 2013-11-03 DIAGNOSIS — I10 Essential (primary) hypertension: Secondary | ICD-10-CM | POA: Insufficient documentation

## 2013-11-03 DIAGNOSIS — Z9119 Patient's noncompliance with other medical treatment and regimen: Secondary | ICD-10-CM | POA: Insufficient documentation

## 2013-11-03 DIAGNOSIS — F17200 Nicotine dependence, unspecified, uncomplicated: Secondary | ICD-10-CM | POA: Insufficient documentation

## 2013-11-03 DIAGNOSIS — R202 Paresthesia of skin: Secondary | ICD-10-CM

## 2013-11-03 DIAGNOSIS — Z91199 Patient's noncompliance with other medical treatment and regimen due to unspecified reason: Secondary | ICD-10-CM | POA: Insufficient documentation

## 2013-11-03 DIAGNOSIS — R209 Unspecified disturbances of skin sensation: Secondary | ICD-10-CM | POA: Insufficient documentation

## 2013-11-03 LAB — POCT I-STAT TROPONIN I: Troponin i, poc: 0 ng/mL (ref 0.00–0.08)

## 2013-11-03 LAB — POCT I-STAT, CHEM 8
BUN: 21 mg/dL (ref 6–23)
CALCIUM ION: 1.21 mmol/L (ref 1.12–1.23)
Chloride: 104 mEq/L (ref 96–112)
Creatinine, Ser: 0.8 mg/dL (ref 0.50–1.35)
Glucose, Bld: 100 mg/dL — ABNORMAL HIGH (ref 70–99)
HEMATOCRIT: 51 % (ref 39.0–52.0)
Hemoglobin: 17.3 g/dL — ABNORMAL HIGH (ref 13.0–17.0)
POTASSIUM: 3.9 meq/L (ref 3.7–5.3)
Sodium: 142 mEq/L (ref 137–147)
TCO2: 24 mmol/L (ref 0–100)

## 2013-11-03 MED ORDER — ENALAPRIL-HYDROCHLOROTHIAZIDE 5-12.5 MG PO TABS: 1.0000 | ORAL_TABLET | Freq: Every day | ORAL | Status: DC

## 2013-11-03 NOTE — ED Notes (Signed)
EDP at the bedside.  ?

## 2013-11-03 NOTE — Discharge Instructions (Signed)
Hypertension Call the wellness Center or any of the numbers on the resource guide to get a primary care physician. Ask your new primary care physician to help you to stop smoking. Call in 2 days to get the next available appointment. Your blood pressure should be rechecked within a week. You can go to urgent care Center to get your blood pressure rechecked. Start taking blood pressure medication prescribed today. Hypertension is another name for high blood pressure. High blood pressure may mean that your heart needs to work harder to pump blood. Blood pressure consists of two numbers, which includes a higher number over a lower number (example: 110/72). HOME CARE   Make lifestyle changes as told by your doctor. This may include weight loss and exercise.  Take your blood pressure medicine every day.  Limit how much salt you use.  Stop smoking if you smoke.  Do not use drugs.  Talk to your doctor if you are using decongestants or birth control pills. These medicines might make blood pressure higher.  Females should not drink more than 1 alcoholic drink per day. Males should not drink more than 2 alcoholic drinks per day.  See your doctor as told. GET HELP RIGHT AWAY IF:   You have a blood pressure reading with a top number of 180 or higher.  You get a very bad headache.  You get blurred or changing vision.  You feel confused.  You feel weak, numb, or faint.  You get chest or belly (abdominal) pain.  You throw up (vomit).  You cannot breathe very well. MAKE SURE YOU:   Understand these instructions.  Will watch your condition.  Will get help right away if you are not doing well or get worse. Document Released: 03/01/2008 Document Revised: 12/06/2011 Document Reviewed: 03/01/2008 [facility] Patient Information 2014 [place], [place].

## 2013-11-03 NOTE — ED Notes (Signed)
Pt. Stated, i've had some left arm numbness for 4 months but more numbness this morning.  Taken BP med. in the past  But quick taken it.

## 2013-11-03 NOTE — ED Notes (Signed)
MD at bedside. 

## 2013-11-03 NOTE — ED Provider Notes (Signed)
CSN: [id number]     Arrival date & time 11/03/13  0757 History   First MD Initiated Contact with Patient 11/03/13 [phone]     Chief Complaint  Patient presents with  . Numbness   (Consider location/radiation/quality/duration/timing/severity/associated sxs/prior Treatment) HPI Complains of left arm numbness for the past 3 months. Left arm numbness to include entire forearm and entire hand. Symptoms became worse today at 3 AM. Symptoms are not made worse by exertion He denies any chest pain no shortness of breath no nausea no headache no neck pain no difficulty speaking no loss of strength. Symptoms are improved by raising his arm above his head and by putting his fist no difficulty with gait. Nothing makes symptoms worse.. patient has been noncompliant with blood pressure medication since 2008 Past Medical History  Diagnosis Date  . Hypertension    No past surgical history on file. Family History  Problem Relation Age of Onset  . Diabetes Mother   . Hypertension Mother    History  Substance Use Topics  . Smoking status: Current Every Day Smoker -- 0.50 packs/day    Types: Cigarettes  . Smokeless tobacco: Never Used  . Alcohol Use: No    Review of Systems  Constitutional: Negative.   HENT: Negative.   Respiratory: Negative.   Cardiovascular: Negative.   Gastrointestinal: Negative.   Musculoskeletal: Negative.   Skin: Negative.   Neurological: Positive for numbness.  Psychiatric/Behavioral: Negative.   All other systems reviewed and are negative.    Allergies  Review of patient's allergies indicates no known allergies.  Home Medications  No current outpatient prescriptions on file. BP 167/111  Pulse 64  Temp(Src) 97.4 F (36.3 C) (Oral)  Resp 20  SpO2 99% Physical Exam  Nursing note and vitals reviewed. Constitutional: He is oriented to person, place, and time. He appears well-developed and well-nourished.  HENT:  Head: Normocephalic and atraumatic.  Eyes:  Conjunctivae are normal. Pupils are equal, round, and reactive to light.  Neck: Neck supple. No tracheal deviation present. No thyromegaly present.  Cardiovascular: Normal rate and regular rhythm.   No murmur heard. Pulmonary/Chest: Effort normal and breath sounds normal.  Abdominal: Soft. Bowel sounds are normal. He exhibits no distension. There is no tenderness.  Musculoskeletal: Normal range of motion. He exhibits no edema and no tenderness.  Neurological: He is alert and oriented to person, place, and time. He has normal reflexes. He displays normal reflexes. No cranial nerve deficit. He exhibits normal muscle tone. Coordination normal.  Gait normal motor strength 5 over 5 overall  Skin: Skin is warm and dry. No rash noted.  Psychiatric: He has a normal mood and affect.    ED Course  Procedures (including critical care time) Labs Review Labs Reviewed - No data to display Imaging Review No results found.  EKG Interpretation    Date/Time:  Saturday November 03 2013 08:02:25 EST Ventricular Rate:  62 PR Interval:  192 QRS Duration: 86 QT Interval:  406 QTC Calculation: 412 R Axis:   71 Text Interpretation:  Normal sinus rhythm with sinus arrhythmia Anterior infarct , age undetermined T wave abnormality, consider inferolateral ischemia Abnormal ECG No significant change since last tracing Confirmed by Winfred Leeds  MD, Suan Pyeatt ([id number]) on 11/03/2013 8:10:13 AM           Results for orders placed during the hospital encounter of 11/03/13  POCT I-STAT, CHEM 8      Result Value Range   Sodium 142  137 - 147 mEq/L  Potassium 3.9  3.7 - 5.3 mEq/L   Chloride 104  96 - 112 mEq/L   BUN 21  6 - 23 mg/dL   Creatinine, Ser 0.80  0.50 - 1.35 mg/dL   Glucose, Bld 100 (*) 70 - 99 mg/dL   Calcium, Ion 1.21  1.12 - 1.23 mmol/L   TCO2 24  0 - 100 mmol/L   Hemoglobin 17.3 (*) 13.0 - 17.0 g/dL   HCT 51.0  39.0 - 52.0 %  POCT I-STAT TROPONIN I      Result Value Range   Troponin i, poc 0.00   0.00 - 0.08 ng/mL   Comment 3            No results found.  councilled pt dfor 5 minutes on smoking cessation MDM  No diagnosis found. No signs of stroke or acute coronary syndrome. Negative troponin after >6 hours of symptoms .no signs of stroke Suspect peripheral neuropathy . Plan prescription enalapril-HCTZ. Referral wellness Center in resource guide to get a primary care physician. Blood pressure recheck 1 week  5 minutes on smoking cessation Diagnosis #1 paresthesias #2 hypertension #3 tobacco abuse #4 medication noncompliance .    Orlie Dakin, MD 11/03/13 [phone]

## 2013-11-27 ENCOUNTER — Encounter (HOSPITAL_COMMUNITY): Payer: Self-pay | Admitting: Emergency Medicine

## 2013-11-27 ENCOUNTER — Emergency Department (HOSPITAL_COMMUNITY)
Admission: EM | Admit: 2013-11-27 | Discharge: 2013-11-27 | Disposition: A | Discharge status: Home / Self Care | Payer: Worker's Compensation | Attending: Emergency Medicine | Admitting: Emergency Medicine

## 2013-11-27 DIAGNOSIS — IMO0002 Reserved for concepts with insufficient information to code with codable children: Secondary | ICD-10-CM | POA: Insufficient documentation

## 2013-11-27 DIAGNOSIS — S7010XA Contusion of unspecified thigh, initial encounter: Secondary | ICD-10-CM | POA: Insufficient documentation

## 2013-11-27 DIAGNOSIS — I10 Essential (primary) hypertension: Secondary | ICD-10-CM | POA: Insufficient documentation

## 2013-11-27 DIAGNOSIS — Y990 Civilian activity done for income or pay: Secondary | ICD-10-CM | POA: Insufficient documentation

## 2013-11-27 DIAGNOSIS — W230XXA Caught, crushed, jammed, or pinched between moving objects, initial encounter: Secondary | ICD-10-CM | POA: Insufficient documentation

## 2013-11-27 DIAGNOSIS — Y9389 Activity, other specified: Secondary | ICD-10-CM | POA: Insufficient documentation

## 2013-11-27 DIAGNOSIS — Z79899 Other long term (current) drug therapy: Secondary | ICD-10-CM | POA: Insufficient documentation

## 2013-11-27 DIAGNOSIS — Y929 Unspecified place or not applicable: Secondary | ICD-10-CM | POA: Insufficient documentation

## 2013-11-27 DIAGNOSIS — F17200 Nicotine dependence, unspecified, uncomplicated: Secondary | ICD-10-CM | POA: Insufficient documentation

## 2013-11-27 NOTE — ED Notes (Signed)
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 Pt states was stuck between the cubes for a couple seconds before two guys were able to lift it off of him. Pt c/o bilateral pain to proximal posterior legs and lower back pain. Pt ambulatory with steady gait. Pt denies hitting head. NAD noted at this time.

## 2013-11-27 NOTE — Discharge Instructions (Signed)
Take tylenol or ibuprofen as needed for pain.  You can alternate these medications every three hours if necessary.  Apply ice pack to sore areas 2-3 times a day for 15-20 minutes.  Avoid activities that aggravate pain.    You may return to the ER if symptoms worsen or you have any other concerns.

## 2013-11-28 NOTE — ED Provider Notes (Signed)
Medical screening examination/treatment/procedure(s) were performed by non-physician practitioner and as supervising physician I was immediately available for consultation/collaboration.   EKG Interpretation None        Gesselle Fitzsimons T Chipper Koudelka, MD 11/28/13 1200 

## 2013-11-28 NOTE — ED Provider Notes (Signed)
CSN: [id number]     Arrival date & time 11/27/13  1926 History   First MD Initiated Contact with Patient 11/27/13 2052     Chief Complaint  Patient presents with  . Leg Pain  . Work Related Injury     (Consider location/radiation/quality/duration/timing/severity/associated sxs/prior Treatment) HPI History provided by pt.   Pt reports that a palate of cinder blocks slid off of the top of a pile into his posterior thighs, pinning him up against a wall at work this evening.  He is unsure of how they fell.  A coworker had to lift it off of him so he slide out.  Has non-radiating pain posterior thighs that is aggravated by palpation and bearing weight.  No associated skin changes, edema, paresthesias.  Has mild pain in lower back as well. Has not taken anything for sx.  Past Medical History  Diagnosis Date  . Hypertension    History reviewed. No pertinent past surgical history. Family History  Problem Relation Age of Onset  . Diabetes Mother   . Hypertension Mother    History  Substance Use Topics  . Smoking status: Current Every Day Smoker -- 0.50 packs/day    Types: Cigarettes  . Smokeless tobacco: Never Used  . Alcohol Use: No    Review of Systems  All other systems reviewed and are negative.      Allergies  Review of patient's allergies indicates no known allergies.  Home Medications   Current Outpatient Rx  Name  Route  Sig  Dispense  Refill  . Enalapril-Hydrochlorothiazide 5-12.5 MG per tablet   Oral   Take 1 tablet by mouth daily.   30 tablet   1    BP 146/97  Pulse 59  Temp(Src) 97.8 F (36.6 C) (Oral)  Resp 16  Ht 5\' 6"  (1.676 m)  Wt 205 lb (92.987 kg)  BMI 33.10 kg/m2  SpO2 97% Physical Exam  Nursing note and vitals reviewed. Constitutional: He is oriented to person, place, and time. He appears well-developed and well-nourished.  HENT:  Head: Normocephalic and atraumatic.  Eyes:  Normal appearance  Neck: Normal range of motion.  Cardiovascular:  Normal rate and regular rhythm.   Pulmonary/Chest: Effort normal and breath sounds normal.  Genitourinary:  No CVA ttp  Musculoskeletal:  Mild lumbar spinal and bilateral paraspinal ttp.  No edema, ecchymosis or abrasions of posterior thighs.  Mild tenderness L posteromedial thigh only. Full active ROM of LE but pain in L posterior thigh w/ L knee extension.  5/5 hip abduction/adduction and ankle plantar/dorsiflexion strength. Nml patellar reflexes. Distal sensation intact.  2+ DP pulses.  Ambulates w/out diffulty.   Neurological: He is alert and oriented to person, place, and time.  Skin: Skin is warm and dry. No rash noted.  Psychiatric: He has a normal mood and affect. His behavior is normal.    ED Course  Procedures (including critical care time) Labs Review Labs Reviewed - No data to display Imaging Review No results found.   EKG Interpretation None      MDM   Final diagnoses:  Contusion of thigh    43yo M presents w/ work related injury.  Reports that a palate of cinder blocks slid off the top of a pile and hit him in posterior thighs, pinning him against a wall.  No visible trauma, full ROM BLE, no NV deficits, ambulatory on exam.  Low suspicion for fx.  Exam is not supportive of history.  Will treat symptomatically w/ tylenol,  motrin, ice, rest.  Pt requested a note for work.  Return precautions discussed.    Arville Lime Nashid Pellum, PA-C 11/28/13 0830

## 2016-01-14 ENCOUNTER — Emergency Department (HOSPITAL_COMMUNITY): Payer: Self-pay

## 2016-01-14 ENCOUNTER — Encounter (HOSPITAL_COMMUNITY): Payer: Self-pay | Admitting: *Deleted

## 2016-01-14 ENCOUNTER — Emergency Department (HOSPITAL_COMMUNITY)
Admission: EM | Admit: 2016-01-14 | Discharge: 2016-01-14 | Disposition: A | Discharge status: Home / Self Care | Payer: Self-pay | Attending: Emergency Medicine | Admitting: Emergency Medicine

## 2016-01-14 DIAGNOSIS — Z87828 Personal history of other (healed) physical injury and trauma: Secondary | ICD-10-CM | POA: Insufficient documentation

## 2016-01-14 DIAGNOSIS — Z79899 Other long term (current) drug therapy: Secondary | ICD-10-CM | POA: Insufficient documentation

## 2016-01-14 DIAGNOSIS — I10 Essential (primary) hypertension: Secondary | ICD-10-CM | POA: Insufficient documentation

## 2016-01-14 DIAGNOSIS — F17210 Nicotine dependence, cigarettes, uncomplicated: Secondary | ICD-10-CM | POA: Insufficient documentation

## 2016-01-14 DIAGNOSIS — M25511 Pain in right shoulder: Secondary | ICD-10-CM | POA: Insufficient documentation

## 2016-01-14 NOTE — Discharge Instructions (Signed)
Please read and follow all provided instructions.  Your diagnoses today include:  1. Right shoulder pain    Tests performed today include:  Vital signs. See below for your results today.   Medications prescribed:   None   Home care instructions:  Follow any educational materials contained in this packet.  Follow-up instructions: Please follow-up with your Orthopedic Doctor for further evaluation of symptoms and treatment   Return instructions:   Please return to the Emergency Department if you do not get better, if you get worse, or new symptoms OR  - Fever (temperature greater than 101.70F)  - Bleeding that does not stop with holding pressure to the area    -Severe pain (please note that you may be more sore the day after your accident)  - Chest Pain  - Difficulty breathing  - Severe nausea or vomiting  - Inability to tolerate food and liquids  - Passing out  - Skin becoming red around your wounds  - Change in mental status (confusion or lethargy)  - New numbness or weakness     Please return if you have any other emergent concerns.  Additional Information:  Your vital signs today were: BP 189/104 mmHg   Pulse 69   Temp(Src) 98 F (36.7 C)   Resp 16   Ht 5\' 6"  (1.676 m)   Wt 99.508 kg   BMI 35.43 kg/m2   SpO2 100% If your blood pressure (BP) was elevated above 135/85 this visit, please have this repeated by your doctor within one month. ---------------

## 2016-01-14 NOTE — ED Provider Notes (Signed)
CSN: [id number]     Arrival date & time 01/14/16  1527   By signing my name below, I, Randa Evens, attest that this documentation has been prepared under the direction and in the presence of Shary Decamp, PA-C. Electronically Signed: Randa Evens, ED Scribe. 01/14/2016. 4:33 PM.    Chief Complaint  Patient presents with  . Arm Pain   The history is provided by the patient. No language interpreter was used.   HPI Comments: Robert Pratt is a 45 y.o. male who presents to the Emergency Department complaining of intermittent burning right shoulder pain onset 6 month prior that recently worsened 3 days prior. Pt rates the severity of his pain 8/10. Pt reports 6 months prior he did have a accident while at work to cause the initial injury. Pt states that the pain radiates all the way down to his hand. Pt state that elevating his hand provides slight relief. Pt reports that he he does a lot of repetitive lifting at work that makes the pain worse. Pt states that he has tried aleve and Excedrin with no relief.  Pt denies new injury or trauma. Pt denies numbness.   Past Medical History  Diagnosis Date  . Hypertension    History reviewed. No pertinent past surgical history. Family History  Problem Relation Age of Onset  . Diabetes Mother   . Hypertension Mother    Social History  Substance Use Topics  . Smoking status: Current Every Day Smoker -- 0.50 packs/day    Types: Cigarettes  . Smokeless tobacco: Never Used  . Alcohol Use: No    Review of Systems A complete 10 system review of systems was obtained and all systems are negative except as noted in the HPI and PMH.   Allergies  Review of patient's allergies indicates no known allergies.  Home Medications   Prior to Admission medications   Medication Sig Start Date End Date Taking? Authorizing Provider  Enalapril-Hydrochlorothiazide 5-12.5 MG per tablet Take 1 tablet by mouth daily. 11/03/13   Orlie Dakin, MD   BP 189/104  mmHg  Pulse 69  Temp(Src) 98 F (36.7 C)  Resp 16  Ht 5\' 6"  (1.676 m)  Wt 219 lb 6 oz (99.508 kg)  BMI 35.43 kg/m2  SpO2 100%   Physical Exam  Constitutional: He is oriented to person, place, and time. He appears well-developed and well-nourished. No distress.  HENT:  Head: Normocephalic and atraumatic.  Eyes: Conjunctivae and EOM are normal.  Neck: Neck supple. No tracheal deviation present.  Cardiovascular: Normal rate and regular rhythm.   Pulmonary/Chest: Effort normal. No respiratory distress.  Abdominal: Soft.  Musculoskeletal: Normal range of motion.  Left shoulder: Negative hawkins test, negative Neer's test, no TTP over shoulder or elbow. Pain with internal rotation of shoulder. No pain with flexion/extension/abduction/adduction or external rotation. No obvious bony deformity.   Neurological: He is alert and oriented to person, place, and time.  Skin: Skin is warm and dry.  Psychiatric: He has a normal mood and affect. His behavior is normal. Thought content normal.  Nursing note and vitals reviewed.   ED Course  Procedures (including critical care time) DIAGNOSTIC STUDIES: Oxygen Saturation is 100% on RA, normal by my interpretation.    COORDINATION OF CARE: 4:32 PM-Discussed treatment plan with pt at bedside and pt agreed to plan.   Labs Review Labs Reviewed - No data to display  Imaging Review Dg Shoulder Right  01/14/2016  CLINICAL DATA:  Right shoulder pain for  3 days, no acute injury. Remote injury 3 months ago. Initial encounter EXAM: RIGHT SHOULDER - 2+ VIEW COMPARISON:  None. FINDINGS: There is no evidence of fracture or dislocation. There is no evidence of arthropathy or other focal bone abnormality. Soft tissues are unremarkable. IMPRESSION: No acute abnormality noted. Electronically Signed   By: Inez Catalina M.D.   On: 01/14/2016 16:53      EKG Interpretation None      MDM   I have reviewed and evaluated the relevant imaging studies. I have  reviewed the relevant previous healthcare records. I obtained HPI from historian.  ED Course:  Assessment: Pt is a 44yM with hx HTN who presents with right shoulder pain. On exam, pt in NAD. Nontoxic/nonseptic appearing. VSS. Afebrile. Pain with internal rotation of shoulder. Suspect rotator cuff injury. XR unremarkable. Given sling in ED. Plan is to [place] with follow up to Orthopedics. Counseled on blood pressure control with diet and exercise. At time of discharge, Patient is in no acute distress. Vital Signs are stable. Patient is able to ambulate. Patient able to tolerate PO.   Disposition/Plan:  DC Home Additional Verbal discharge instructions given and discussed with patient.  Pt Instructed to f/u with Orthopedics for evaluation and treatment of symptoms. Return precautions given Pt acknowledges and agrees with plan  Supervising Physician Noemi Chapel, MD   Final diagnoses:  Right shoulder pain     I personally performed the services described in this documentation, which was scribed in my presence. The recorded information has been reviewed and is accurate.   Shary Decamp, PA-C 01/14/16 1731  Noemi Chapel, MD 01/15/16 Dyann Kief

## 2016-01-14 NOTE — ED Notes (Signed)
Declined W/C at D/C and was escorted to lobby by RN. 

## 2016-01-14 NOTE — ED Notes (Signed)
The pt is c/o rt shoulder and arm pain for 6 ,momths.  Worse for 3 days  He lifts heavy equipment at work

## 2017-04-01 ENCOUNTER — Ambulatory Visit (INDEPENDENT_AMBULATORY_CARE_PROVIDER_SITE_OTHER): Payer: Managed Care, Other (non HMO) | Admitting: Physician Assistant

## 2017-04-01 ENCOUNTER — Encounter: Payer: Self-pay | Admitting: Physician Assistant

## 2017-04-01 VITALS — BP 150/110 | HR 67 | Temp 98.7°F | Ht 67.5 in | Wt 222.5 lb

## 2017-04-01 DIAGNOSIS — I10 Essential (primary) hypertension: Secondary | ICD-10-CM

## 2017-04-01 LAB — BASIC METABOLIC PANEL
BUN: 13 mg/dL (ref 7–25)
CO2: 24 mmol/L (ref 20–31)
Calcium: 9.7 mg/dL (ref 8.6–10.3)
Chloride: 101 mmol/L (ref 98–110)
Creat: 0.95 mg/dL (ref 0.60–1.35)
Glucose, Bld: 101 mg/dL — ABNORMAL HIGH (ref 65–99)
POTASSIUM: 5 mmol/L (ref 3.5–5.3)
Sodium: 136 mmol/L (ref 135–146)

## 2017-04-01 MED ORDER — AMLODIPINE BESYLATE 5 MG PO TABS: 5.0000 mg | ORAL_TABLET | Freq: Every day | ORAL | 0 refills | Status: DC

## 2017-04-01 MED ORDER — LISINOPRIL-HYDROCHLOROTHIAZIDE 10-12.5 MG PO TABS: 1.0000 | ORAL_TABLET | Freq: Every day | ORAL | 0 refills | Status: DC

## 2017-04-01 NOTE — Patient Instructions (Addendum)
It was great to meet you!  Start both blood pressure medications.  Write down your blood pressures every day.

## 2017-04-01 NOTE — Progress Notes (Signed)
Robert Pratt is a 46 y.o. male here to [place] and Hypertension.  I acted as a Education administrator for [employer], PA-C [name], LPN  History of Present Illness:   Chief Complaint  Patient presents with  . Establish Care    [facility]  . Hypertension    Acute Concerns: None  Chronic Issues: HTN -- has had for several years, has not been taking his blood pressure medications very regularly over the past 6 months -- states that he maybe takes his BP meds (he is unsure of which ones) approximately once a week, currently smokes 1/2 PPD of cigarettes, no alcohol, diet is variable, does not drink caffeine -- stopped sodas just this past Sunday, no significant stress in life, no exercise. Current weight is 222lb, was at 175 lb back in 2010. Gained weight from working, being sedentary at home. He is motivated to get back to a healthier lifestyle. His wife established care in our office earlier this week and they are planning to get a blood pressure cuff soon. Denies blurred vision, chest pain, SOB. Has had intermittent headaches over the past few days.  Health Maintenance: Caffeine intake -- none Sleep habits -- does snore, he is unsure if he stops breathing or has apneic spells, no hx of OSA but brother has it Exercise -- does not excercise Weight -- Weight: 222 lb 8 oz (100.9 kg)  Mood -- no issues with depression or anxiety  Depression screen [facility] 2/9 04/01/2017  Decreased Interest 0  Down, Depressed, Hopeless 0  PHQ - 2 Score 0    No flowsheet data found.   Other providers/specialists: None   Past Medical History:  Diagnosis Date  . Hypertension      Social History   Social History  . Marital status: Married    Spouse name: N/A  . Number of children: N/A  . Years of education: N/A   Occupational History  . Not on file.   Social History Main Topics  . Smoking status: Current Every Day Smoker    Packs/day: 0.50    Years: 30.00    Types: Cigarettes  .  Smokeless tobacco: Never Used  . Alcohol use No  . Drug use: No  . Sexual activity: Yes   Other Topics Concern  . Not on file   Social History Narrative   Freight forwarder   Married with children    History reviewed. No pertinent surgical history.  Family History  Problem Relation Age of Onset  . Diabetes Mother   . Hypertension Mother   . Cancer Father        bone  . Diabetes Father   . Hypertension Father   . Prostate cancer Neg Hx     No Known Allergies   Current Medications:   Current Outpatient Prescriptions:  .  ibuprofen (ADVIL,MOTRIN) 200 MG tablet, Take 400-600 mg by mouth every 6 (six) hours as needed., Disp: , Rfl:  .  lisinopril-hydrochlorothiazide (PRINZIDE,ZESTORETIC) 10-12.5 MG tablet, Take 1 tablet by mouth daily., Disp: 30 tablet, Rfl: 0 .  amLODipine (NORVASC) 5 MG tablet, Take 1 tablet (5 mg total) by mouth daily., Disp: 30 tablet, Rfl: 0   Review of Systems:   Review of Systems  Constitutional: Negative for chills, fever, malaise/fatigue and weight loss.  HENT: Negative for hearing loss, sinus pain and sore throat.   Eyes: Negative for blurred vision.  Respiratory: Negative for cough and shortness of breath.   Cardiovascular: Negative for chest pain,  palpitations and leg swelling.  Gastrointestinal: Negative for abdominal pain, constipation, diarrhea, heartburn, nausea and vomiting.  Genitourinary: Negative for dysuria, frequency and urgency.  Musculoskeletal: Negative for back pain, myalgias and neck pain.  Skin: Negative for itching and rash.  Neurological: Positive for headaches. Negative for dizziness, tingling, seizures and loss of consciousness.       Yesterday and Wednesday this week.  Endo/Heme/Allergies: Negative for polydipsia.  Psychiatric/Behavioral: Negative for depression. The patient is not nervous/anxious.     Vitals:   Vitals:   04/01/17 1401 04/01/17 1441  BP: (!) 158/110 (!) 150/110  Pulse: 79 67  Temp: 98.7 F (37.1  C)   TempSrc: Oral   SpO2: 96% 96%  Weight: 222 lb 8 oz (100.9 kg)   Height: 5' 7.5" (1.715 m)      Body mass index is 34.33 kg/m.  Physical Exam:   Physical Exam  Constitutional: He appears well-developed. He is cooperative.  Non-toxic appearance. He does not have a sickly appearance. He does not appear ill. No distress.  Cardiovascular: Normal rate, regular rhythm, S1 normal, S2 normal, normal heart sounds and normal pulses.   No LE edema  Pulmonary/Chest: Effort normal and breath sounds normal.  Neurological: He is alert.  Psychiatric: He has a normal mood and affect. His speech is normal and behavior is normal. Thought content normal.  Nursing note and vitals reviewed.   Assessment and Plan:    Oree was seen today for establish care and hypertension.  Diagnoses and all orders for this visit:  Hypertension, unspecified type Will start Norvasc 5 mg and Lisinopril-HCTZ 10-12.5 mg today. Provided blood pressure log, follow-up in 2 weeks for evaluation for blood pressure rx efficacy and for CPE. Labs today to assess kidney function/electrolytes and urine. I advised for him to let us know if symptoms are severe or if he has others concerns. -     Basic metabolic panel -     Microalbumin / creatinine urine ratio  Other orders -     amLODipine (NORVASC) 5 MG tablet; Take 1 tablet (5 mg total) by mouth daily. -     lisinopril-hydrochlorothiazide (PRINZIDE,ZESTORETIC) 10-12.5 MG tablet; Take 1 tablet by mouth daily.   . Reviewed expectations re: course of current medical issues. . Discussed self-management of symptoms. . Outlined signs and symptoms indicating need for more acute intervention. . Patient verbalized understanding and all questions were answered. . See orders for this visit as documented in the electronic medical record. . Patient received an After-Visit Summary.  CMA or LPN served as scribe during this visit. History, Physical, and Plan performed by medical  provider. Documentation and orders reviewed and attested to.  Inda Coke, PA-C

## 2017-04-02 LAB — MICROALBUMIN / CREATININE URINE RATIO
Creatinine, Urine: 250 mg/dL (ref 20–370)
MICROALB/CREAT RATIO: 17 ug/mg{creat} (ref <30)
Microalb, Ur: 4.2 mg/dL

## 2017-04-15 ENCOUNTER — Ambulatory Visit (INDEPENDENT_AMBULATORY_CARE_PROVIDER_SITE_OTHER): Payer: Managed Care, Other (non HMO) | Admitting: Physician Assistant

## 2017-04-15 ENCOUNTER — Encounter: Payer: Self-pay | Admitting: Physician Assistant

## 2017-04-15 VITALS — BP 130/88 | HR 73 | Temp 98.3°F | Ht 67.5 in | Wt 222.5 lb

## 2017-04-15 DIAGNOSIS — Z13220 Encounter for screening for lipoid disorders: Secondary | ICD-10-CM

## 2017-04-15 DIAGNOSIS — Z125 Encounter for screening for malignant neoplasm of prostate: Secondary | ICD-10-CM | POA: Diagnosis not present

## 2017-04-15 DIAGNOSIS — E669 Obesity, unspecified: Secondary | ICD-10-CM

## 2017-04-15 DIAGNOSIS — I10 Essential (primary) hypertension: Secondary | ICD-10-CM | POA: Diagnosis not present

## 2017-04-15 DIAGNOSIS — Z0000 Encounter for general adult medical examination without abnormal findings: Secondary | ICD-10-CM | POA: Diagnosis not present

## 2017-04-15 DIAGNOSIS — Z833 Family history of diabetes mellitus: Secondary | ICD-10-CM

## 2017-04-15 DIAGNOSIS — Z136 Encounter for screening for cardiovascular disorders: Secondary | ICD-10-CM

## 2017-04-15 DIAGNOSIS — R7303 Prediabetes: Secondary | ICD-10-CM | POA: Insufficient documentation

## 2017-04-15 DIAGNOSIS — Z114 Encounter for screening for human immunodeficiency virus [HIV]: Secondary | ICD-10-CM | POA: Diagnosis not present

## 2017-04-15 LAB — LIPID PANEL
Cholesterol: 176 mg/dL (ref <200)
HDL: 38 mg/dL — AB (ref >40)
LDL Cholesterol: 123 mg/dL — ABNORMAL HIGH (ref <100)
Total CHOL/HDL Ratio: 4.6 Ratio (ref <5.0)
Triglycerides: 77 mg/dL (ref <150)
VLDL: 15 mg/dL (ref <30)

## 2017-04-15 LAB — POCT GLYCOSYLATED HEMOGLOBIN (HGB A1C): Hemoglobin A1C: 6.3

## 2017-04-15 MED ORDER — AMLODIPINE BESYLATE 5 MG PO TABS: 5.0000 mg | ORAL_TABLET | Freq: Every day | ORAL | 0 refills | Status: DC

## 2017-04-15 MED ORDER — METFORMIN HCL 500 MG PO TABS: 500.0000 mg | ORAL_TABLET | Freq: Every day | ORAL | 0 refills | Status: DC

## 2017-04-15 NOTE — Patient Instructions (Addendum)
It was great to see you!    Start making shakes/smooothies for breakfast!  Let's follow-up in 3 months.  Health Maintenance, Male A healthy lifestyle and preventive care is important for your health and wellness. Ask your health care provider about what schedule of regular examinations is right for you. What should I know about weight and diet? Eat a Healthy Diet  Eat plenty of vegetables, fruits, whole grains, low-fat dairy products, and lean protein.  Do not eat a lot of foods high in solid fats, added sugars, or salt.  Maintain a Healthy Weight Regular exercise can help you achieve or maintain a healthy weight. You should:  Do at least 150 minutes of exercise each week. The exercise should increase your heart rate and make you sweat (moderate-intensity exercise).  Do strength-training exercises at least twice a week.  Watch Your Levels of Cholesterol and Blood Lipids  Have your blood tested for lipids and cholesterol every 5 years starting at 46 years of age. If you are at high risk for heart disease, you should start having your blood tested when you are 46 years old. You may need to have your cholesterol levels checked more often if: ? Your lipid or cholesterol levels are high. ? You are older than 46 years of age. ? You are at high risk for heart disease.  What should I know about cancer screening? Many types of cancers can be detected early and may often be prevented. Lung Cancer  You should be screened every year for lung cancer if: ? You are a current smoker who has smoked for at least 30 years. ? You are a former smoker who has quit within the past 15 years.  Talk to your health care provider about your screening options, when you should start screening, and how often you should be screened.  Colorectal Cancer  Routine colorectal cancer screening usually begins at 46 years of age and should be repeated every 5-10 years until you are 46 years old. You may need to  be screened more often if early forms of precancerous polyps or small growths are found. Your health care provider may recommend screening at an earlier age if you have risk factors for colon cancer.  Your health care provider may recommend using home test kits to check for hidden blood in the stool.  A small camera at the end of a tube can be used to examine your colon (sigmoidoscopy or colonoscopy). This checks for the earliest forms of colorectal cancer.  Prostate and Testicular Cancer  Depending on your age and overall health, your health care provider may do certain tests to screen for prostate and testicular cancer.  Talk to your health care provider about any symptoms or concerns you have about testicular or prostate cancer.  Skin Cancer  Check your skin from head to toe regularly.  Tell your health care provider about any new moles or changes in moles, especially if: ? There is a change in a mole's size, shape, or color. ? You have a mole that is larger than a pencil eraser.  Always use sunscreen. Apply sunscreen liberally and repeat throughout the day.  Protect yourself by wearing long sleeves, pants, a wide-brimmed hat, and sunglasses when outside.  What should I know about heart disease, diabetes, and high blood pressure?  If you are 18-1 years of age, have your blood pressure checked every 3-5 years. If you are 61 years of age or older, have your blood pressure checked  every year. You should have your blood pressure measured twice-once when you are at a hospital or clinic, and once when you are not at a hospital or clinic. Record the average of the two measurements. To check your blood pressure when you are not at a hospital or clinic, you can use: ? An automated blood pressure machine at a pharmacy. ? A home blood pressure monitor.  Talk to your health care provider about your target blood pressure.  If you are between 93-77 years old, ask your health care provider if  you should take aspirin to prevent heart disease.  Have regular diabetes screenings by checking your fasting blood sugar level. ? If you are at a normal weight and have a low risk for diabetes, have this test once every three years after the age of 43. ? If you are overweight and have a high risk for diabetes, consider being tested at a younger age or more often.  A one-time screening for abdominal aortic aneurysm (AAA) by ultrasound is recommended for men aged 40-75 years who are current or former smokers. What should I know about preventing infection? Hepatitis B If you have a higher risk for hepatitis B, you should be screened for this virus. Talk with your health care provider to find out if you are at risk for hepatitis B infection. Hepatitis C Blood testing is recommended for:  Everyone born from [birth year] through 1965.  Anyone with known risk factors for hepatitis C.  Sexually Transmitted Diseases (STDs)  You should be screened each year for STDs including gonorrhea and chlamydia if: ? You are sexually active and are younger than 46 years of age. ? You are older than 46 years of age and your health care provider tells you that you are at risk for this type of infection. ? Your sexual activity has changed since you were last screened and you are at an increased risk for chlamydia or gonorrhea. Ask your health care provider if you are at risk.  Talk with your health care provider about whether you are at high risk of being infected with HIV. Your health care provider may recommend a prescription medicine to help prevent HIV infection.  What else can I do?  Schedule regular health, dental, and eye exams.  Stay current with your vaccines (immunizations).  Do not use any tobacco products, such as cigarettes, chewing tobacco, and e-cigarettes. If you need help quitting, ask your health care provider.  Limit alcohol intake to no more than 2 drinks per day. One drink equals 12 ounces of  beer, 5 ounces of wine, or 1 ounces of hard liquor.  Do not use street drugs.  Do not share needles.  Ask your health care provider for help if you need support or information about quitting drugs.  Tell your health care provider if you often feel depressed.  Tell your health care provider if you have ever been abused or do not feel safe at home. This information is not intended to replace advice given to you by your health care provider. Make sure you discuss any questions you have with your health care provider. Document Released: 03/11/2008 Document Revised: 05/12/2016 Document Reviewed: 06/17/2015 Elsevier Interactive Patient Education  [employer].

## 2017-04-15 NOTE — Progress Notes (Signed)
I acted as a Education administrator for [employer], PA-C [name], LPN  Subjective:    Robert Pratt is a 46 y.o. male and is here for a comprehensive physical exam.  HPI  Health Maintenance Due  Topic Date Due  . HIV Screening  [birth year]1986   Acute Concerns: None  Chronic Issues: HTN -- blood pressure has been well-controlled with Norvasc 5 mg. Home readings ranging from 341-937 systolic to 90-24 diastolic. No chest pain, SOB, blurred vision, swelling in legs.  Obesity -- he is very motivated to work on diet, eats a lot of home-cooked southern foods and fast foods. Has stopped sodas but is drinking Vitamin [place]. Family hx of DM -- he has concerns for becoming DM, mother and father both with DM2  Wt Readings from Last 3 Encounters:  04/15/17 222 lb 8 oz (100.9 kg)  04/01/17 222 lb 8 oz (100.9 kg)  01/14/16 219 lb 6 oz (99.5 kg)    Health Maintenance: Immunizations -- up to date Colonoscopy -- no family hx, readdress at age 70 Caffeine intake -- none Sleep habits -- sleeps well, does snore but does not think he has pauses in his breathing Exercise -- walking at work Weight -- Weight: 222 lb 8 oz (100.9 kg) -- highest weight was 265 lb back in 2004, was at 175 lb  Mood -- great   Depression screen PHQ 2/9 04/15/2017  Decreased Interest 0  Down, Depressed, Hopeless 0  PHQ - 2 Score 0   Other providers/specialists: Dentist -- last seen 2 months ago   PMHx, SurgHx, SocialHx, Medications, and Allergies were reviewed in the Visit Navigator and updated as appropriate.   Past Medical History:  Diagnosis Date  . Hypertension     No past surgical history on file.   Family History  Problem Relation Age of Onset  . Diabetes Mother   . Hypertension Mother   . Cancer Father        bone, liver  . Diabetes Father   . Hypertension Father   . Prostate cancer Neg Hx     Social History  Substance Use Topics  . Smoking status: Current Every Day Smoker    Packs/day: 0.50      Years: 30.00    Types: Cigarettes  . Smokeless tobacco: Never Used  . Alcohol use No    Review of Systems:   Review of Systems  Constitutional: Negative for chills, fever, malaise/fatigue and weight loss.  HENT: Negative for hearing loss, sinus pain and sore throat.   Respiratory: Negative for cough and hemoptysis.   Cardiovascular: Negative for chest pain, palpitations, leg swelling and PND.  Gastrointestinal: Negative for abdominal pain, constipation, diarrhea, heartburn, nausea and vomiting.  Genitourinary: Negative for dysuria, frequency and urgency.  Musculoskeletal: Negative for back pain, myalgias and neck pain.  Skin: Negative for itching and rash.  Neurological: Negative for dizziness, tingling, seizures and headaches.  Endo/Heme/Allergies: Negative for polydipsia.  Psychiatric/Behavioral: Negative for depression. The patient is not nervous/anxious.     Objective:   Vitals:   04/15/17 1259  BP: 130/88  Pulse: 73  Temp: 98.3 F (36.8 C)   Body mass index is 34.33 kg/m.  General Appearance:  Alert, cooperative, no distress, appears stated age  Head:  Normocephalic, without obvious abnormality, atraumatic  Eyes:  PERRL, conjunctiva/corneas clear, EOM's intact, fundi benign, both eyes       Ears:  Normal TM's and external ear canals, both ears  Nose: Nares normal, septum midline, mucosa  normal, no drainage    or sinus tenderness  Throat: Lips, mucosa, and tongue normal; teeth and gums normal  Neck: Supple, symmetrical, trachea midline, no adenopathy; thyroid:  No enlargement/tenderness/nodules; no carotit bruit or JVD  Back:   Symmetric, no curvature, ROM normal, no CVA tenderness  Lungs:   Clear to auscultation bilaterally, respirations unlabored  Chest wall:  No tenderness or deformity  Heart:  Regular rate and rhythm, S1 and S2 normal, no murmur, rub   or gallop  Abdomen:   Soft, non-tender, bowel sounds active all four quadrants, no masses, no organomegaly   Extremities: Extremities normal, atraumatic, no cyanosis or edema  Prostate: Normal rectal tone, fecal occult negative, small amount of stool burden felt in rectal vault, prostate not enlarged or tender on exam  Skin: Skin color, texture, turgor normal, no rashes or lesions  Lymph nodes: Cervical, supraclavicular, and axillary nodes normal  Neurologic: CNII-XII grossly intact. Normal strength, sensation and reflexes throughout    Results for orders placed or performed in visit on 04/15/17  POCT glycosylated hemoglobin (Hb A1C)  Result Value Ref Range   Hemoglobin A1C 6.3     Assessment/Plan:   [name] was seen today for annual exam.  Diagnoses and all orders for this visit:  Routine general medical examination at a health care facility Today patient counseled on age appropriate routine health concerns for screening and prevention, each reviewed and up to date or declined. Immunizations reviewed and up to date or declined. Labs ordered and reviewed. Risk factors for depression reviewed and negative. Hearing function and visual acuity are intact. ADLs screened and addressed as needed. Functional ability and level of safety reviewed and appropriate. Education, counseling and referrals performed based on assessed risks today. Patient provided with a copy of personalized plan for preventive services. -     CBC with Differential/Platelet; Future  Obesity (BMI 30-39.9) Discussed dietary changes, including adding in breakfast, choosing more balanced snacks, and decreasing fast food intake.  -     POCT glycosylated hemoglobin (Hb A1C)  Family history of diabetes mellitus POCT HgbA1c is 6.3. Will start 500 mg Metformin daily. Follow-up with [place] in 3 months. Diet changes discussed. -     POCT glycosylated hemoglobin (Hb A1C)  Prostate cancer screening Discussed prostate cancer screening risks and benefits. He is agreeable to DRE and PSA and verbalized understanding of results and next steps if  labs abnormal. -     PSA  Encounter for lipid screening for cardiovascular disease -     Lipid panel  Encounter for screening for HIV -     HIV antibody  Essential hypertension Continue Norvasc 5mg . Follow-up in 3 months. He is going to ask his wife if he has any apneic spells while sleeping, consider sleep study.   Other orders -     metFORMIN (GLUCOPHAGE) 500 MG tablet; Take 1 tablet (500 mg total) by mouth daily with breakfast. -     amLODipine (NORVASC) 5 MG tablet; Take 1 tablet (5 mg total) by mouth daily.  Well Adult Exam: Labs ordered: Yes. Patient counseling was done. See below for items discussed. Discussed the patient's BMI.  The BMI BMI is not in the acceptable range; BMI management plan is completed Follow up in 3 months.  Patient Counseling: [x]   Nutrition: Stressed importance of moderation in sodium/caffeine intake, saturated fat and cholesterol, caloric balance, sufficient intake of fresh fruits, vegetables, and fiber.  [x]   Stressed the importance of regular exercise.   []   Substance Abuse: Discussed cessation/primary prevention of tobacco, alcohol, or other drug use; driving or other dangerous activities under the influence; availability of treatment for abuse.   [x]   Injury prevention: Discussed safety belts, safety helmets, smoke detector, smoking near bedding or upholstery.   []   Sexuality: Discussed sexually transmitted diseases, partner selection, use of condoms, avoidance of unintended pregnancy  and contraceptive alternatives.   [x]   Dental health: Discussed importance of regular tooth brushing, flossing, and dental visits.  [x]   Health maintenance and immunizations reviewed. Please refer to Health maintenance section.     CMA or LPN served as scribe during this visit. History, Physical, and Plan performed by medical provider. Documentation and orders reviewed and attested to.  Inda Coke, PA-C [place]

## 2017-04-16 LAB — PSA: PSA: 0.9 ng/mL (ref <=4.0)

## 2017-04-16 LAB — HIV ANTIBODY (ROUTINE TESTING W REFLEX): HIV 1&2 Ab, 4th Generation: NONREACTIVE

## 2017-04-26 ENCOUNTER — Encounter (HOSPITAL_BASED_OUTPATIENT_CLINIC_OR_DEPARTMENT_OTHER): Payer: Self-pay | Admitting: *Deleted

## 2017-04-26 ENCOUNTER — Emergency Department (HOSPITAL_BASED_OUTPATIENT_CLINIC_OR_DEPARTMENT_OTHER)
Admission: EM | Admit: 2017-04-26 | Discharge: 2017-04-26 | Disposition: A | Discharge status: Home / Self Care | Payer: Managed Care, Other (non HMO) | Attending: Emergency Medicine | Admitting: Emergency Medicine

## 2017-04-26 ENCOUNTER — Emergency Department (HOSPITAL_BASED_OUTPATIENT_CLINIC_OR_DEPARTMENT_OTHER): Payer: Managed Care, Other (non HMO)

## 2017-04-26 DIAGNOSIS — Y929 Unspecified place or not applicable: Secondary | ICD-10-CM | POA: Diagnosis not present

## 2017-04-26 DIAGNOSIS — Z7984 Long term (current) use of oral hypoglycemic drugs: Secondary | ICD-10-CM | POA: Diagnosis not present

## 2017-04-26 DIAGNOSIS — S63631A Sprain of interphalangeal joint of left index finger, initial encounter: Secondary | ICD-10-CM

## 2017-04-26 DIAGNOSIS — Y9301 Activity, walking, marching and hiking: Secondary | ICD-10-CM | POA: Diagnosis not present

## 2017-04-26 DIAGNOSIS — F17210 Nicotine dependence, cigarettes, uncomplicated: Secondary | ICD-10-CM | POA: Diagnosis not present

## 2017-04-26 DIAGNOSIS — Y999 Unspecified external cause status: Secondary | ICD-10-CM | POA: Diagnosis not present

## 2017-04-26 DIAGNOSIS — Z79899 Other long term (current) drug therapy: Secondary | ICD-10-CM | POA: Diagnosis not present

## 2017-04-26 DIAGNOSIS — M79645 Pain in left finger(s): Secondary | ICD-10-CM | POA: Diagnosis not present

## 2017-04-26 DIAGNOSIS — W010XXA Fall on same level from slipping, tripping and stumbling without subsequent striking against object, initial encounter: Secondary | ICD-10-CM | POA: Insufficient documentation

## 2017-04-26 DIAGNOSIS — I10 Essential (primary) hypertension: Secondary | ICD-10-CM | POA: Diagnosis not present

## 2017-04-26 NOTE — ED Notes (Signed)
Pt given note for work.

## 2017-04-26 NOTE — ED Provider Notes (Signed)
[facility] DEPT [facility] Provider Note   CSN: [id number] Arrival date & time: 04/26/17  2355     History   Chief Complaint Chief Complaint  Patient presents with  . Hand Injury    HPI Robert Pratt is a 46 y.o. male.  Patient is a 46 year old male presenting for evaluation of a left index finger injury. He reports falling in the rain while outside of the scar 2 days ago. Has been uncomfortable and swollen since that time.   The history is provided by the patient.  Hand Injury   The incident occurred 2 days ago. The injury mechanism was a fall. Pain location: Left index finger. The pain is moderate. The pain has been constant since the incident.    Past Medical History:  Diagnosis Date  . Hypertension     Patient Active Problem List   Diagnosis Date Noted  . Hypertension 04/15/2017  . Obesity (BMI 30-39.9) 04/15/2017  . Pre-diabetes 04/15/2017    History reviewed. No pertinent surgical history.     Home Medications    Prior to Admission medications   Medication Sig Start Date End Date Taking? Authorizing Provider  amLODipine (NORVASC) 5 MG tablet Take 1 tablet (5 mg total) by mouth daily. 04/15/17  Yes Inda Coke, PA  lisinopril-hydrochlorothiazide (PRINZIDE,ZESTORETIC) 10-12.5 MG tablet Take 1 tablet by mouth daily. 04/01/17  Yes Inda Coke, PA  metFORMIN (GLUCOPHAGE) 500 MG tablet Take 1 tablet (500 mg total) by mouth daily with breakfast. 04/15/17  Yes Inda Coke, PA  ibuprofen (ADVIL,MOTRIN) 200 MG tablet Take 400-600 mg by mouth every 6 (six) hours as needed.    [provider]    Family History Family History  Problem Relation Age of Onset  . Diabetes Mother   . Hypertension Mother   . Cancer Father        bone, liver  . Diabetes Father   . Hypertension Father   . Prostate cancer Neg Hx     Social History Social History  Substance Use Topics  . Smoking status: Current Every Day Smoker    Packs/day: 0.50    Years:  30.00    Types: Cigarettes  . Smokeless tobacco: Never Used  . Alcohol use No     Allergies   Patient has no known allergies.   Review of Systems Review of Systems  All other systems reviewed and are negative.    Physical Exam Updated Vital Signs BP (!) 161/98 (BP Location: Right Arm)   Pulse 68   Temp 97.9 F (36.6 C) (Oral)   Resp 18   Ht 5\' 7"  (1.702 m)   Wt 99.8 kg (220 lb)   SpO2 98%   BMI 34.46 kg/m   Physical Exam  Constitutional: He is oriented to person, place, and time. He appears well-developed and well-nourished.  HENT:  Head: Normocephalic and atraumatic.  Neck: Normal range of motion. Neck supple.  Musculoskeletal:  The left index finger has mild swelling and tenderness over the PIP joint. He has slightly limited range of motion secondary to pain, however flexion and extension are intact. Cap refill is brisk and sensation is intact to the fingertip.  Neurological: He is alert and oriented to person, place, and time.  Skin: Skin is warm and dry.  Nursing note and vitals reviewed.    ED Treatments / Results  Labs (all labs ordered are listed, but only abnormal results are displayed) Labs Reviewed - No data to display  EKG  EKG Interpretation None  Radiology Dg Hand Complete Left  Result Date: 04/26/2017 CLINICAL DATA:  Index finger pain and swelling after fall yesterday. EXAM: LEFT HAND - COMPLETE 3+ VIEW COMPARISON:  None. FINDINGS: There is no evidence of fracture or dislocation. There is no evidence of arthropathy or other focal bone abnormality. Mild soft tissue swelling of the index finger. IMPRESSION: Mild index finger soft tissue swelling.  No fracture. Electronically Signed   By: Titus Dubin M.D.   On: 04/26/2017 08:19    Procedures Procedures (including critical care time)  Medications Ordered in ED Medications - No data to display   Initial Impression / Assessment and Plan / ED Course  I have reviewed the triage vital  signs and the nursing notes.  Pertinent labs & imaging results that were available during my care of the patient were reviewed by me and considered in my medical decision making (see chart for details).  X-ray show no evidence for fracture, only soft tissue swelling adjacent to the PIP joint. Will be placed in a splint, advised ice, rest, and follow-up as needed if not improving in the next 1-2 weeks.  Final Clinical Impressions(s) / ED Diagnoses   Final diagnoses:  None    New Prescriptions New Prescriptions   No medications on file     Veryl Speak, MD 04/26/17 [phone]

## 2017-04-26 NOTE — ED Triage Notes (Signed)
Pt reports fell in rain yesterday and landed on his left hand

## 2017-04-26 NOTE — Discharge Instructions (Signed)
Wear splint as applied for the next several days, then slowly reintroduce activity.  Ice for 20 minutes every 2 hours while awake for the next 2 days.  Follow-up with your primary Dr. if not improving in the next 1-2 weeks.

## 2017-04-27 ENCOUNTER — Other Ambulatory Visit: Payer: Self-pay | Admitting: Physician Assistant

## 2017-04-27 ENCOUNTER — Telehealth: Payer: Self-pay | Admitting: Physician Assistant

## 2017-04-27 MED ORDER — ATORVASTATIN CALCIUM 20 MG PO TABS: 20.0000 mg | ORAL_TABLET | Freq: Every day | ORAL | 0 refills | Status: DC

## 2017-04-27 NOTE — Telephone Encounter (Signed)
Left message on voicemail to call office.  

## 2017-04-27 NOTE — Telephone Encounter (Signed)
Please call patient and let him know that we need to start cholesterol medication. I have sent in 20 mg lipitor for 3 months, we will follow-up when he returns to f/u vist.  Inda Coke PA-C 04/27/17

## 2017-04-28 ENCOUNTER — Other Ambulatory Visit: Payer: Self-pay | Admitting: *Deleted

## 2017-04-28 MED ORDER — LISINOPRIL-HYDROCHLOROTHIAZIDE 10-12.5 MG PO TABS: 1.0000 | ORAL_TABLET | Freq: Every day | ORAL | 0 refills | Status: DC

## 2017-04-28 NOTE — Telephone Encounter (Signed)
Pt called back, told him [facility] wants to start you on cholesterol medication. Rx for Lipitor 20 mg one tablet daily was sent to pharmacy. Pt verbalized understanding and said he picked up medication yesterday pharmacy called him. Okay just wanted to let you know, see you at follow up in Oct. Pt verbalized understanding.

## 2017-04-28 NOTE — Telephone Encounter (Signed)
Left message on voicemail to call office.  

## 2017-06-07 ENCOUNTER — Other Ambulatory Visit: Payer: Self-pay | Admitting: *Deleted

## 2017-06-07 MED ORDER — LISINOPRIL-HYDROCHLOROTHIAZIDE 10-12.5 MG PO TABS: 1.0000 | ORAL_TABLET | Freq: Every day | ORAL | 0 refills | Status: DC

## 2017-06-07 MED ORDER — METFORMIN HCL 500 MG PO TABS: 500.0000 mg | ORAL_TABLET | Freq: Every day | ORAL | 0 refills | Status: DC

## 2017-06-07 MED ORDER — ATORVASTATIN CALCIUM 20 MG PO TABS: 20.0000 mg | ORAL_TABLET | Freq: Every day | ORAL | 0 refills | Status: DC

## 2017-06-07 MED ORDER — AMLODIPINE BESYLATE 5 MG PO TABS: 5.0000 mg | ORAL_TABLET | Freq: Every day | ORAL | 0 refills | Status: DC

## 2017-07-13 ENCOUNTER — Encounter: Payer: Self-pay | Admitting: Physician Assistant

## 2017-07-13 ENCOUNTER — Ambulatory Visit (INDEPENDENT_AMBULATORY_CARE_PROVIDER_SITE_OTHER): Payer: Managed Care, Other (non HMO) | Admitting: Physician Assistant

## 2017-07-13 VITALS — BP 160/100 | HR 88 | Temp 98.5°F | Ht 67.0 in | Wt 222.5 lb

## 2017-07-13 DIAGNOSIS — J069 Acute upper respiratory infection, unspecified: Secondary | ICD-10-CM

## 2017-07-13 DIAGNOSIS — I10 Essential (primary) hypertension: Secondary | ICD-10-CM | POA: Diagnosis not present

## 2017-07-13 MED ORDER — BENZONATATE 200 MG PO CAPS: 200.0000 mg | ORAL_CAPSULE | Freq: Two times a day (BID) | ORAL | 0 refills | Status: DC | PRN

## 2017-07-13 MED ORDER — AZITHROMYCIN 250 MG PO TABS: ORAL_TABLET | ORAL | 0 refills | Status: DC

## 2017-07-13 MED ORDER — AMLODIPINE BESYLATE 10 MG PO TABS: 10.0000 mg | ORAL_TABLET | Freq: Every day | ORAL | 0 refills | Status: DC

## 2017-07-13 MED ORDER — LISINOPRIL-HYDROCHLOROTHIAZIDE 10-12.5 MG PO TABS: 1.0000 | ORAL_TABLET | Freq: Every day | ORAL | 0 refills | Status: DC

## 2017-07-13 NOTE — Patient Instructions (Signed)
Start azithromycin antibiotic, take with food.  We are increasing your Norvasc from 5mg  to 10 mg daily.  Continue the other dosage of your blood pressure medication, lisinopril-HCTZ 10-12.5 mg.  May continue Delsym and Mucinex for cough. I have also sent in [place] for you to take as needed for your cough. Please try to decrease your smoking!!!  We need to follow-up in 4 weeks to discuss your blood pressure and re-check your cholesterol and blood sugar. Please keep a blood pressure log.

## 2017-07-13 NOTE — Progress Notes (Signed)
Robert Pratt is a 46 y.o. male is here to follow up on Hypertension.  I acted as a Education administrator for [employer], PA-C  [name], LPN  History of Present Illness:   Chief Complaint  Patient presents with  . Hypertension  . Cough    x 5 days    Hypertension  This is a chronic problem. Episode onset: 3 month follow up, pt has been taking blood pressure at home, systolic ranging 629-528, diastolic ranging 41-32. The problem has been gradually improving since onset. The problem is controlled. Associated symptoms include shortness of breath. Pertinent negatives include no headaches or peripheral edema. Risk factors for coronary artery disease include obesity and smoking/tobacco exposure. The current treatment provides moderate improvement. There are no compliance problems.   Cough  This is a new problem. Episode onset: Started Friday, x 5 days. The problem has been waxing and waning. The problem occurs constantly. The cough is productive of sputum (white / brown sputum). Associated symptoms include chills, shortness of breath and wheezing. Pertinent negatives include no fever or headaches. The symptoms are aggravated by lying down. He has tried OTC cough suppressant (Mucinex, Zyrtec) for the symptoms. The treatment provided no relief.   Wife with similar symptoms. She went to the doctor and was told her that she had "flu-like" symptoms. Was given supportive care.  He has taken Mucinex, Zyrtec and Delsym. Still smoking. SOB with cough, no SOB prior to development of URI. No fever. Denies recent travel or immobilization.  Wt Readings from Last 3 Encounters:  07/13/17 222 lb 8 oz (100.9 kg)  04/26/17 220 lb (99.8 kg)  04/15/17 222 lb 8 oz (100.9 kg)    BP Readings from Last 3 Encounters:  07/13/17 (!) 160/100  04/26/17 (!) 161/98  04/15/17 130/88      There are no preventive care reminders to display for this patient.  Past Medical History:  Diagnosis Date  . Hypertension        Social History   Social History  . Marital status: Married    Spouse name: N/A  . Number of children: N/A  . Years of education: N/A   Occupational History  . Not on file.   Social History Main Topics  . Smoking status: Current Every Day Smoker    Packs/day: 0.50    Years: 30.00    Types: Cigarettes  . Smokeless tobacco: Never Used  . Alcohol use No  . Drug use: No  . Sexual activity: Yes   Other Topics Concern  . Not on file   Social History Narrative   Freight forwarder   Married with children    No past surgical history on file.  Family History  Problem Relation Age of Onset  . Diabetes Mother   . Hypertension Mother   . Cancer Father        bone, liver  . Diabetes Father   . Hypertension Father   . Prostate cancer Neg Hx     PMHx, SurgHx, SocialHx, FamHx, Medications, and Allergies were reviewed in the Visit Navigator and updated as appropriate.   Patient Active Problem List   Diagnosis Date Noted  . Hypertension 04/15/2017  . Obesity (BMI 30-39.9) 04/15/2017  . Pre-diabetes 04/15/2017    Social History  Substance Use Topics  . Smoking status: Current Every Day Smoker    Packs/day: 0.50    Years: 30.00    Types: Cigarettes  . Smokeless tobacco: Never Used  . Alcohol use No  Current Medications and Allergies:    Current Outpatient Prescriptions:  .  atorvastatin (LIPITOR) 20 MG tablet, Take 1 tablet (20 mg total) by mouth daily., Disp: 90 tablet, Rfl: 0 .  ibuprofen (ADVIL,MOTRIN) 200 MG tablet, Take 400-600 mg by mouth every 6 (six) hours as needed., Disp: , Rfl:  .  lisinopril-hydrochlorothiazide (PRINZIDE,ZESTORETIC) 10-12.5 MG tablet, Take 1 tablet by mouth daily., Disp: 90 tablet, Rfl: 0 .  metFORMIN (GLUCOPHAGE) 500 MG tablet, Take 1 tablet (500 mg total) by mouth daily with breakfast., Disp: 90 tablet, Rfl: 0 .  amLODipine (NORVASC) 10 MG tablet, Take 1 tablet (10 mg total) by mouth daily., Disp: 90 tablet, Rfl: 0 .   azithromycin (ZITHROMAX) 250 MG tablet, Take 2 tablets on day one, and then 1 tablet daily for 4 days, Disp: 6 tablet, Rfl: 0 .  benzonatate (TESSALON) 200 MG capsule, Take 1 capsule (200 mg total) by mouth 2 (two) times daily as needed for cough., Disp: 20 capsule, Rfl: 0  No Known Allergies  Review of Systems   Review of Systems  Constitutional: Positive for chills. Negative for fever.  Respiratory: Positive for cough, shortness of breath and wheezing.   Neurological: Negative for headaches.    Vitals:   Vitals:   07/13/17 1540 07/13/17 1557  BP: (!) 164/100 (!) 160/100  Pulse: 88   Temp: 98.5 F (36.9 C)   TempSrc: Oral   SpO2: 96%   Weight: 222 lb 8 oz (100.9 kg)   Height: 5\' 7"  (1.702 m)      Body mass index is 34.85 kg/m.   Physical Exam:    Physical Exam  Constitutional: He appears well-developed. He is cooperative.  Non-toxic appearance. He does not have a sickly appearance. He does not appear ill. No distress.  HENT:  Head: Normocephalic and atraumatic.  Right Ear: Tympanic membrane, external ear and ear canal normal. Tympanic membrane is not erythematous, not retracted and not bulging.  Left Ear: Tympanic membrane, external ear and ear canal normal. Tympanic membrane is not erythematous, not retracted and not bulging.  Nose: Mucosal edema present. Right sinus exhibits no maxillary sinus tenderness and no frontal sinus tenderness. Left sinus exhibits no maxillary sinus tenderness and no frontal sinus tenderness.  Mouth/Throat: Uvula is midline and mucous membranes are normal. No posterior oropharyngeal edema or posterior oropharyngeal erythema. Tonsils are 1+ on the right. Tonsils are 1+ on the left. No tonsillar exudate.  Eyes: Conjunctivae and lids are normal.  Neck: Trachea normal.  Cardiovascular: Normal rate, regular rhythm, S1 normal, S2 normal, normal heart sounds and normal pulses.   No LE edema  Pulmonary/Chest: Effort normal and breath sounds normal. No  tachypnea and no bradypnea. No respiratory distress. He has no decreased breath sounds. He has no wheezes. He has no rhonchi. He has no rales.  Lymphadenopathy:    He has no cervical adenopathy.  Neurological: He is alert. GCS eye subscore is 4. GCS verbal subscore is 5. GCS motor subscore is 6.  Skin: Skin is warm, dry and intact.  Psychiatric: He has a normal mood and affect. His speech is normal and behavior is normal.  Nursing note and vitals reviewed.    Assessment and Plan:    Kiyan was seen today for hypertension and cough.  Diagnoses and all orders for this visit:  Essential hypertension Increase Norvasc to 10 mg daily. Continue Prinzide 10-12.5 mg. Continue BP log at home. BMP today. Follow-up in 1 month for further evaluation of BP. (Would  also like to follow-up on pre-diabetes and cholesterol at that time as well -- discussed with patient.) -     Basic metabolic panel  Upper respiratory tract infection, unspecified type No red flags on exam. Lungs CTA bilaterally. Suspect URI with possible early bronchitis, discussed need for smoking cessation. Start z-pack, I have also sent in [place]. May continue Mucinex. Follow-up if symptoms worsen or persist, especially if fever/SOB.  Other orders -     amLODipine (NORVASC) 10 MG tablet; Take 1 tablet (10 mg total) by mouth daily. -     lisinopril-hydrochlorothiazide (PRINZIDE,ZESTORETIC) 10-12.5 MG tablet; Take 1 tablet by mouth daily. -     azithromycin (ZITHROMAX) 250 MG tablet; Take 2 tablets on day one, and then 1 tablet daily for 4 days -     benzonatate (TESSALON) 200 MG capsule; Take 1 capsule (200 mg total) by mouth 2 (two) times daily as needed for cough.    . Reviewed expectations re: course of current medical issues. . Discussed self-management of symptoms. . Outlined signs and symptoms indicating need for more acute intervention. . Patient verbalized understanding and all questions were answered. . See orders  for this visit as documented in the electronic medical record. . Patient received an After Visit Summary.  CMA or LPN served as scribe during this visit. History, Physical, and Plan performed by medical provider. Documentation and orders reviewed and attested to.  Inda Coke, PA-C [place] [place] 07/13/2017  Follow-up: Return in about 1 month (around 08/13/2017) for cholesterol, DM, HTN f/u.

## 2017-07-14 LAB — BASIC METABOLIC PANEL
BUN: 15 mg/dL (ref 6–23)
CALCIUM: 9.6 mg/dL (ref 8.4–10.5)
CO2: 26 meq/L (ref 19–32)
CREATININE: 0.93 mg/dL (ref 0.40–1.50)
Chloride: 103 mEq/L (ref 96–112)
GFR: 112.35 mL/min (ref >60.00)
Glucose, Bld: 107 mg/dL — ABNORMAL HIGH (ref 70–99)
Potassium: 3.6 mEq/L (ref 3.5–5.1)
Sodium: 139 mEq/L (ref 135–145)

## 2017-07-15 ENCOUNTER — Ambulatory Visit: Payer: Managed Care, Other (non HMO) | Admitting: Physician Assistant

## 2017-08-15 ENCOUNTER — Ambulatory Visit: Payer: Managed Care, Other (non HMO) | Admitting: Physician Assistant

## 2017-09-19 ENCOUNTER — Other Ambulatory Visit: Payer: Self-pay

## 2017-09-19 MED ORDER — METFORMIN HCL 500 MG PO TABS: 500.0000 mg | ORAL_TABLET | Freq: Every day | ORAL | 1 refills | Status: DC

## 2018-01-07 ENCOUNTER — Other Ambulatory Visit: Payer: Self-pay | Admitting: Physician Assistant

## 2018-04-15 ENCOUNTER — Other Ambulatory Visit: Payer: Self-pay | Admitting: Physician Assistant

## 2018-07-07 ENCOUNTER — Encounter: Payer: Self-pay | Admitting: Physician Assistant

## 2018-07-07 ENCOUNTER — Other Ambulatory Visit: Payer: Self-pay | Admitting: Physician Assistant

## 2018-07-07 ENCOUNTER — Ambulatory Visit (INDEPENDENT_AMBULATORY_CARE_PROVIDER_SITE_OTHER): Payer: Managed Care, Other (non HMO) | Admitting: Physician Assistant

## 2018-07-07 VITALS — BP 148/100 | HR 73 | Temp 98.6°F | Ht 67.0 in | Wt 230.0 lb

## 2018-07-07 DIAGNOSIS — I10 Essential (primary) hypertension: Secondary | ICD-10-CM | POA: Diagnosis not present

## 2018-07-07 DIAGNOSIS — R7303 Prediabetes: Secondary | ICD-10-CM

## 2018-07-07 DIAGNOSIS — E785 Hyperlipidemia, unspecified: Secondary | ICD-10-CM | POA: Diagnosis not present

## 2018-07-07 LAB — CBC WITH DIFFERENTIAL/PLATELET
BASOS ABS: 0 10*3/uL (ref 0.0–0.1)
Basophils Relative: 0.4 % (ref 0.0–3.0)
EOS ABS: 0.1 10*3/uL (ref 0.0–0.7)
Eosinophils Relative: 2.8 % (ref 0.0–5.0)
HCT: 48.8 % (ref 39.0–52.0)
Hemoglobin: 16.2 g/dL (ref 13.0–17.0)
LYMPHS ABS: 1.8 10*3/uL (ref 0.7–4.0)
Lymphocytes Relative: 38.6 % (ref 12.0–46.0)
MCHC: 33.1 g/dL (ref 30.0–36.0)
MCV: 87.6 fl (ref 78.0–100.0)
MONO ABS: 0.3 10*3/uL (ref 0.1–1.0)
MONOS PCT: 7.1 % (ref 3.0–12.0)
NEUTROS ABS: 2.4 10*3/uL (ref 1.4–7.7)
NEUTROS PCT: 51.1 % (ref 43.0–77.0)
PLATELETS: 240 10*3/uL (ref 150.0–400.0)
RBC: 5.57 Mil/uL (ref 4.22–5.81)
RDW: 13.3 % (ref 11.5–15.5)
WBC: 4.6 10*3/uL (ref 4.0–10.5)

## 2018-07-07 LAB — COMPREHENSIVE METABOLIC PANEL
ALK PHOS: 46 U/L (ref 39–117)
ALT: 14 U/L (ref 0–53)
AST: 16 U/L (ref 0–37)
Albumin: 4.3 g/dL (ref 3.5–5.2)
BILIRUBIN TOTAL: 0.6 mg/dL (ref 0.2–1.2)
BUN: 15 mg/dL (ref 6–23)
CO2: 30 mEq/L (ref 19–32)
Calcium: 9.6 mg/dL (ref 8.4–10.5)
Chloride: 102 mEq/L (ref 96–112)
Creatinine, Ser: 0.97 mg/dL (ref 0.40–1.50)
GFR: 106.57 mL/min (ref >60.00)
GLUCOSE: 103 mg/dL — AB (ref 70–99)
Potassium: 4 mEq/L (ref 3.5–5.1)
SODIUM: 138 meq/L (ref 135–145)
TOTAL PROTEIN: 7.6 g/dL (ref 6.0–8.3)

## 2018-07-07 LAB — LIPID PANEL
CHOL/HDL RATIO: 5
Cholesterol: 170 mg/dL (ref 0–200)
HDL: 36.2 mg/dL — AB (ref >39.00)
LDL Cholesterol: 117 mg/dL — ABNORMAL HIGH (ref 0–99)
NONHDL: 133.92
Triglycerides: 86 mg/dL (ref 0.0–149.0)
VLDL: 17.2 mg/dL (ref 0.0–40.0)

## 2018-07-07 LAB — HEMOGLOBIN A1C: Hgb A1c MFr Bld: 6.2 % (ref 4.6–6.5)

## 2018-07-07 MED ORDER — LISINOPRIL-HYDROCHLOROTHIAZIDE 10-12.5 MG PO TABS: 1.0000 | ORAL_TABLET | Freq: Every day | ORAL | 1 refills | Status: DC

## 2018-07-07 MED ORDER — AMLODIPINE BESYLATE 10 MG PO TABS: 10.0000 mg | ORAL_TABLET | Freq: Every day | ORAL | 1 refills | Status: DC

## 2018-07-07 NOTE — Progress Notes (Signed)
Robert Pratt is a 47 y.o. male is here to discuss: Hypertension  I acted as a Education administrator for [employer], PA-C [name], LPN  History of Present Illness:   Chief Complaint  Patient presents with  . Hypertension    Hypertension  This is a chronic problem. Episode onset: Pt had a  physical for work yesterday and blood pressure was elevated 170's/90's? The problem is uncontrolled. Pertinent negatives include no blurred vision, chest pain, headaches, malaise/fatigue, palpitations, peripheral edema or shortness of breath. Risk factors for coronary artery disease include obesity and male gender. Compliance problems: Pt does not take medication regularly, has not taken in the past 4 days.    Has amlodopine and lisinopril-HCTZ 10-12.5 mg at home but has not taken any medications in the past 5 days because he has forgotten. We haven't seen him in our office in awhile. He states that his wife started HD in August and it's been a significant life changer for his household. He has been without his lipitor and metformin for several months.  He is being hired full time at his current place of employment where he was initially hired as a temp.  Wt Readings from Last 5 Encounters:  07/07/18 230 lb (104.3 kg)  07/13/17 222 lb 8 oz (100.9 kg)  04/26/17 220 lb (99.8 kg)  04/15/17 222 lb 8 oz (100.9 kg)  04/01/17 222 lb 8 oz (100.9 kg)      There are no preventive care reminders to display for this patient.  Past Medical History:  Diagnosis Date  . Hypertension      Social History   Socioeconomic History  . Marital status: Married    Spouse name: Not on file  . Number of children: Not on file  . Years of education: Not on file  . Highest education level: Not on file  Occupational History  . Not on file  Social Needs  . Financial resource strain: Not on file  . Food insecurity:    Worry: Not on file    Inability: Not on file  . Transportation needs:    Medical: Not on file     Non-medical: Not on file  Tobacco Use  . Smoking status: Current Every Day Smoker    Packs/day: 0.50    Years: 30.00    Pack years: 15.00    Types: Cigarettes  . Smokeless tobacco: Never Used  Substance and Sexual Activity  . Alcohol use: No  . Drug use: No  . Sexual activity: Yes  Lifestyle  . Physical activity:    Days per week: Not on file    Minutes per session: Not on file  . Stress: Not on file  Relationships  . Social connections:    Talks on phone: Not on file    Gets together: Not on file    Attends religious service: Not on file    Active member of club or organization: Not on file    Attends meetings of clubs or organizations: Not on file    Relationship status: Not on file  . Intimate partner violence:    Fear of current or ex partner: Not on file    Emotionally abused: Not on file    Physically abused: Not on file    Forced sexual activity: Not on file  Other Topics Concern  . 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 No pertinent surgical  history.  Family History  Problem Relation Age of Onset  . Diabetes Mother   . Hypertension Mother   . Cancer Father        bone, liver  . Diabetes Father   . Hypertension Father   . Prostate cancer Neg Hx     PMHx, SurgHx, SocialHx, FamHx, Medications, and Allergies were reviewed in the Visit Navigator and updated as appropriate.   Patient Active Problem List   Diagnosis Date Noted  . Hypertension 04/15/2017  . Obesity (BMI 30-39.9) 04/15/2017  . Pre-diabetes 04/15/2017    Social History   Tobacco Use  . Smoking status: Current Every Day Smoker    Packs/day: 0.50    Years: 30.00    Pack years: 15.00    Types: Cigarettes  . Smokeless tobacco: Never Used  Substance Use Topics  . Alcohol use: No  . Drug use: No    Current Medications and Allergies:    Current Outpatient Medications:  .  amLODipine (NORVASC) 10 MG tablet, Take 1  tablet (10 mg total) by mouth daily., Disp: 30 tablet, Rfl: 1 .  atorvastatin (LIPITOR) 20 MG tablet, Take 1 tablet (20 mg total) by mouth daily., Disp: 90 tablet, Rfl: 0 .  ibuprofen (ADVIL,MOTRIN) 200 MG tablet, Take 400-600 mg by mouth every 6 (six) hours as needed., Disp: , Rfl:  .  lisinopril-hydrochlorothiazide (PRINZIDE,ZESTORETIC) 10-12.5 MG tablet, Take 1 tablet by mouth daily., Disp: 30 tablet, Rfl: 1 .  metFORMIN (GLUCOPHAGE) 500 MG tablet, Take 1 tablet (500 mg total) by mouth daily with breakfast., Disp: 90 tablet, Rfl: 1  No Known Allergies  Review of Systems   Review of Systems  Constitutional: Negative for malaise/fatigue.  Eyes: Negative for blurred vision.  Respiratory: Negative for shortness of breath.   Cardiovascular: Negative for chest pain and palpitations.  Neurological: Negative for headaches.    Vitals:   Vitals:   07/07/18 1304 07/07/18 1324  BP: (!) 152/104 (!) 148/100  Pulse: 73   Temp: 98.6 F (37 C)   TempSrc: Oral   SpO2: 95%   Weight: 230 lb (104.3 kg)   Height: 5\' 7"  (1.702 m)      Body mass index is 36.02 kg/m.   Physical Exam:    Physical Exam  Constitutional: He appears well-developed. He is cooperative.  Non-toxic appearance. He does not have a sickly appearance. He does not appear ill. No distress.  Cardiovascular: Normal rate, regular rhythm, S1 normal, S2 normal, normal heart sounds and normal pulses.  No LE edema  Pulmonary/Chest: Effort normal and breath sounds normal.  Neurological: He is alert. GCS eye subscore is 4. GCS verbal subscore is 5. GCS motor subscore is 6.  Skin: Skin is warm, dry and intact.  Psychiatric: He has a normal mood and affect. His speech is normal and behavior is normal.  Nursing note and vitals reviewed.    Assessment and Plan:    Zalmen was seen today for hypertension.  Diagnoses and all orders for this visit:  Essential hypertension Poorly controlled. I discussed with him that I want to  re-start his medication and follow-up in two weeks. Discussed importance of medication compliance. Follow-up in two weeks, sooner if symptoms develop. -     CBC with Differential/Platelet -     Comprehensive metabolic panel  Hyperlipidemia, unspecified hyperlipidemia type Re-check lipid panel, re-calculate ASCVD and adjust medication prn. -     Lipid panel  Pre-diabetes Re-check HgbA1c and review need for metformin or additional medication. -  Hemoglobin A1c  Other orders -     lisinopril-hydrochlorothiazide (PRINZIDE,ZESTORETIC) 10-12.5 MG tablet; Take 1 tablet by mouth daily. -     amLODipine (NORVASC) 10 MG tablet; Take 1 tablet (10 mg total) by mouth daily.  . Reviewed expectations re: course of current medical issues. . Discussed self-management of symptoms. . Outlined signs and symptoms indicating need for more acute intervention. . Patient verbalized understanding and all questions were answered. . See orders for this visit as documented in the electronic medical record. . Patient received an After Visit Summary.  CMA or LPN served as scribe during this visit. History, Physical, and Plan performed by medical provider. The above documentation has been reviewed and is accurate and complete.  Inda Coke, PA-C [place], [place] 07/07/2018  Follow-up: Return in about 2 weeks (around 07/21/2018) for Blood Pressure F/U.

## 2018-07-07 NOTE — Patient Instructions (Signed)
It was great to see you!  Re-start Norvasc (Amlodopine) and Lisinopril-Hydrochlorothiazide and TAKE EVERY SINGLE DAY.  If you can, please check your blood pressure and keep a log for [place]. Avoid salt.  Let's follow-up in 2 weeks sooner if you have concerns.  Take care,  Inda Coke PA-C

## 2018-07-10 ENCOUNTER — Other Ambulatory Visit: Payer: Self-pay | Admitting: Physician Assistant

## 2018-07-10 ENCOUNTER — Telehealth: Payer: Self-pay | Admitting: Physician Assistant

## 2018-07-10 MED ORDER — ATORVASTATIN CALCIUM 20 MG PO TABS: 20.0000 mg | ORAL_TABLET | Freq: Every day | ORAL | 0 refills | Status: DC

## 2018-07-10 MED ORDER — METFORMIN HCL 500 MG PO TABS: 500.0000 mg | ORAL_TABLET | Freq: Every day | ORAL | 1 refills | Status: DC

## 2018-07-10 NOTE — Telephone Encounter (Signed)
Patient notified of results.

## 2018-07-10 NOTE — Telephone Encounter (Signed)
Copied from [place] [id number]. Topic: Quick Communication - Lab Results (Clinic Use ONLY) >> Jul 10, 2018  8:50 AM Marian Sorrow, LPN wrote: Called patient to inform them of 07/07/2018 lab results. When patient returns call, triage nurse may disclose results.

## 2018-07-11 ENCOUNTER — Other Ambulatory Visit: Payer: Self-pay | Admitting: Physician Assistant

## 2018-07-12 MED ORDER — LISINOPRIL-HYDROCHLOROTHIAZIDE 10-12.5 MG PO TABS: 1.0000 | ORAL_TABLET | Freq: Every day | ORAL | 0 refills | Status: DC

## 2018-07-12 NOTE — Addendum Note (Signed)
Addended by: Marian Sorrow on: 07/12/2018 10:36 AM   Modules accepted: Orders

## 2018-07-24 ENCOUNTER — Encounter: Payer: Self-pay | Admitting: Physician Assistant

## 2018-07-24 ENCOUNTER — Ambulatory Visit (INDEPENDENT_AMBULATORY_CARE_PROVIDER_SITE_OTHER): Payer: Managed Care, Other (non HMO) | Admitting: Physician Assistant

## 2018-07-24 VITALS — BP 126/84 | HR 72 | Temp 98.9°F | Ht 67.0 in | Wt 224.6 lb

## 2018-07-24 DIAGNOSIS — Z720 Tobacco use: Secondary | ICD-10-CM

## 2018-07-24 DIAGNOSIS — I10 Essential (primary) hypertension: Secondary | ICD-10-CM

## 2018-07-24 MED ORDER — LISINOPRIL-HYDROCHLOROTHIAZIDE 10-12.5 MG PO TABS: 1.0000 | ORAL_TABLET | Freq: Every day | ORAL | 1 refills | Status: DC

## 2018-07-24 MED ORDER — AMLODIPINE BESYLATE 10 MG PO TABS: 10.0000 mg | ORAL_TABLET | Freq: Every day | ORAL | 1 refills | Status: DC

## 2018-07-24 MED ORDER — NICOTINE 21 MG/24HR TD PT24: 21.0000 mg | MEDICATED_PATCH | Freq: Every day | TRANSDERMAL | 5 refills | Status: DC

## 2018-07-24 NOTE — Progress Notes (Signed)
Robert Pratt is a 47 y.o. male here for a follow up of a pre-existing problem.  History of Present Illness:   Chief Complaint  Patient presents with  . Hypertension    HPI   Smoking cessation -- has tried to cut back. About 1/2 PPD. Smoking since 47 y/o. Has never tried gums, lozenges or patches.  HTN - Currently taking Norvasc 10, Lisinopril-HCTZ 10-12.5 mg. At home blood pressure readings are: not checked. Patient denies chest pain, SOB, blurred vision, dizziness, unusual headaches, lower leg swelling. Patient is compliant with medication. Denies excessive caffeine intake, stimulant usage, excessive alcohol intake, or increase in salt consumption.  Wt Readings from Last 5 Encounters:  07/24/18 224 lb 9.6 oz (101.9 kg)  07/07/18 230 lb (104.3 kg)  07/13/17 222 lb 8 oz (100.9 kg)  04/26/17 220 lb (99.8 kg)  04/15/17 222 lb 8 oz (100.9 kg)     Past Medical History:  Diagnosis Date  . Hypertension      Social History   Socioeconomic History  . Marital status: Married    Spouse name: Not on file  . Number of children: Not on file  . Years of education: Not on file  . Highest education level: Not on file  Occupational History  . Not on file  Social Needs  . Financial resource strain: Not on file  . Food insecurity:    Worry: Not on file    Inability: Not on file  . Transportation needs:    Medical: Not on file    Non-medical: Not on file  Tobacco Use  . Smoking status: Current Every Day Smoker    Packs/day: 0.50    Years: 30.00    Pack years: 15.00    Types: Cigarettes  . Smokeless tobacco: Never Used  Substance and Sexual Activity  . Alcohol use: No  . Drug use: No  . Sexual activity: Yes  Lifestyle  . Physical activity:    Days per week: Not on file    Minutes per session: Not on file  . Stress: Not on file  Relationships  . Social connections:    Talks on phone: Not on file    Gets together: Not on file    Attends religious service: Not on file   Active member of club or organization: Not on file    Attends meetings of clubs or organizations: Not on file    Relationship status: Not on file  . Intimate partner violence:    Fear of current or ex partner: Not on file    Emotionally abused: Not on file    Physically abused: Not on file    Forced sexual activity: Not on file  Other Topics Concern  . 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 No pertinent surgical history.  Family History  Problem Relation Age of Onset  . Diabetes Mother   . Hypertension Mother   . Cancer Father        bone, liver  . Diabetes Father   . Hypertension Father   . Prostate cancer Neg Hx     No Known Allergies  Current Medications:   Current Outpatient Medications:  .  amLODipine (NORVASC) 10 MG tablet, Take 1 tablet (10 mg total) by mouth daily., Disp: 90 tablet, Rfl: 1 .  atorvastatin (LIPITOR) 20 MG tablet, Take 1 tablet (20 mg total) by mouth daily., Disp: 90 tablet, Rfl: 0 .  ibuprofen (ADVIL,MOTRIN) 200 MG tablet, Take 400-600 mg by mouth every 6 (six) hours as needed., Disp: , Rfl:  .  lisinopril-hydrochlorothiazide (PRINZIDE,ZESTORETIC) 10-12.5 MG tablet, Take 1 tablet by mouth daily., Disp: 90 tablet, Rfl: 1 .  metFORMIN (GLUCOPHAGE) 500 MG tablet, Take 1 tablet (500 mg total) by mouth daily with breakfast., Disp: 90 tablet, Rfl: 1 .  nicotine (NICODERM CQ - DOSED IN MG/24 HOURS) 21 mg/24hr patch, Place 1 patch (21 mg total) onto the skin daily., Disp: 7 patch, Rfl: 5   Review of Systems:   ROS  Negative unless otherwise specified per HPI.  Vitals:   Vitals:   07/24/18 0928  BP: 126/84  Pulse: 72  Temp: 98.9 F (37.2 C)  TempSrc: Oral  SpO2: 95%  Weight: 224 lb 9.6 oz (101.9 kg)  Height: 5\' 7"  (1.702 m)     Body mass index is 35.18 kg/m.  Physical Exam:   Physical Exam  Constitutional: He appears well-developed. He is cooperative.  Non-toxic appearance.  He does not have a sickly appearance. He does not appear ill. No distress.  Cardiovascular: Normal rate, regular rhythm, S1 normal, S2 normal, normal heart sounds and normal pulses.  No LE edema  Pulmonary/Chest: Effort normal and breath sounds normal.  Neurological: He is alert. GCS eye subscore is 4. GCS verbal subscore is 5. GCS motor subscore is 6.  Skin: Skin is warm, dry and intact.  Psychiatric: He has a normal mood and affect. His speech is normal and behavior is normal.  Nursing note and vitals reviewed.   Assessment and Plan:    Robert Pratt was seen today for hypertension.  Diagnoses and all orders for this visit:  Essential hypertension Currently well-controlled. Follow-up in 6 months, continue current regimen.  Tobacco abuse Discussed options with patient. We have decided to trial Nicotine patches. Start daily, reduce smoking and follow-up in 6 weeks, sooner if concerns.  Other orders -     amLODipine (NORVASC) 10 MG tablet; Take 1 tablet (10 mg total) by mouth daily. -     lisinopril-hydrochlorothiazide (PRINZIDE,ZESTORETIC) 10-12.5 MG tablet; Take 1 tablet by mouth daily. -     nicotine (NICODERM CQ - DOSED IN MG/24 HOURS) 21 mg/24hr patch; Place 1 patch (21 mg total) onto the skin daily.   . Reviewed expectations re: course of current medical issues. . Discussed self-management of symptoms. . Outlined signs and symptoms indicating need for more acute intervention. . Patient verbalized understanding and all questions were answered. . See orders for this visit as documented in the electronic medical record. . Patient received an After-Visit Summary.   Inda Coke, PA-C

## 2018-07-24 NOTE — Patient Instructions (Addendum)
It was great to see you!  Start nicoderm patch daily, use daily for 6 weeks and then return to come see [place].  Also please call 1-800-QUIT-NOW and they may be able to set you up with free or reduced cost patches.  Take care,  Inda Coke PA-C

## 2019-06-27 ENCOUNTER — Encounter: Payer: Self-pay | Admitting: Physician Assistant

## 2019-06-27 ENCOUNTER — Ambulatory Visit (INDEPENDENT_AMBULATORY_CARE_PROVIDER_SITE_OTHER): Payer: Managed Care, Other (non HMO) | Admitting: Physician Assistant

## 2019-06-27 ENCOUNTER — Other Ambulatory Visit: Payer: Self-pay

## 2019-06-27 VITALS — BP 164/110 | HR 68 | Temp 98.3°F | Ht 67.0 in | Wt 237.5 lb

## 2019-06-27 DIAGNOSIS — I10 Essential (primary) hypertension: Secondary | ICD-10-CM

## 2019-06-27 DIAGNOSIS — Z720 Tobacco use: Secondary | ICD-10-CM

## 2019-06-27 DIAGNOSIS — Z0000 Encounter for general adult medical examination without abnormal findings: Secondary | ICD-10-CM

## 2019-06-27 DIAGNOSIS — R7303 Prediabetes: Secondary | ICD-10-CM

## 2019-06-27 DIAGNOSIS — E669 Obesity, unspecified: Secondary | ICD-10-CM

## 2019-06-27 DIAGNOSIS — E785 Hyperlipidemia, unspecified: Secondary | ICD-10-CM

## 2019-06-27 MED ORDER — CHANTIX STARTING MONTH PAK 0.5 MG X 11 & 1 MG X 42 PO TABS: ORAL_TABLET | ORAL | 0 refills | Status: DC

## 2019-06-27 MED ORDER — AMLODIPINE BESYLATE 10 MG PO TABS: 10.0000 mg | ORAL_TABLET | Freq: Every day | ORAL | 1 refills | Status: DC

## 2019-06-27 MED ORDER — LISINOPRIL-HYDROCHLOROTHIAZIDE 20-25 MG PO TABS: 1.0000 | ORAL_TABLET | Freq: Every day | ORAL | 1 refills | Status: DC

## 2019-06-27 NOTE — Progress Notes (Signed)
I acted as a Education administrator for [employer], PA-C [name], LPN  Subjective:    Robert Pratt is a 48 y.o. male and is here for a comprehensive physical exam.  HPI  There are no preventive care reminders to display for this patient.  Acute Concerns: None  Chronic Issues: Tobacco abuse --has been using the patch without success.  He is wanting to try if possible scription medication.  He is very ready to quit. Obesity--continues to struggle with obesity.  Has gained over 10 pounds since last seen.  He has been without his metformin and has not been taking it.  He does not exercise. Hypertension-- Currently taking Norvasc 10 mg, lisinopril-hydrochlorothiazide 10-12.5 mg. At home blood pressure readings are: Not checked. Patient denies chest pain, SOB, blurred vision, dizziness, unusual headaches, lower leg swelling. Patient is compliant with medication. Denies excessive caffeine intake, stimulant usage, excessive alcohol intake, or increase in salt consumption. Hyperlipidemia --currently prescribed Lipitor 20 mg daily.  He has been out of this medication for at least 1 month.  Health Maintenance: Immunizations -- UTD, declines Flu Colonoscopy -- N/A PSA -- N/A Diet --variable, does not eat a lot of healthy food Sleep habits --overall sleeps well Exercise --does not exercise Weight -- Weight: 237 lb 8 oz (107.7 kg)  Weight history Wt Readings from Last 10 Encounters:  06/27/19 237 lb 8 oz (107.7 kg)  07/24/18 224 lb 9.6 oz (101.9 kg)  07/07/18 230 lb (104.3 kg)  07/13/17 222 lb 8 oz (100.9 kg)  04/26/17 220 lb (99.8 kg)  04/15/17 222 lb 8 oz (100.9 kg)  04/01/17 222 lb 8 oz (100.9 kg)  01/14/16 219 lb 6 oz (99.5 kg)  11/27/13 205 lb (93 kg)  12/02/12 220 lb (99.8 kg)   Mood --no issues Tobacco use --current smoker, see above Alcohol use --- none  Depression screen PHQ 2/9 06/27/2019  Decreased Interest 0  Down, Depressed, Hopeless 0  PHQ - 2 Score 0     Other  providers/specialists: Patient Care Team: Robert Pratt, [place] as PCP - General (Physician Assistant)   PMHx, SurgHx, SocialHx, Medications, and Allergies were reviewed in the Visit Navigator and updated as appropriate.   Past Medical History:  Diagnosis Date  . Hypertension     History reviewed. No pertinent surgical history.   Family History  Problem Relation Age of Onset  . Diabetes Mother   . Hypertension Mother   . Cancer Father        bone, liver  . Diabetes Father   . Hypertension Father   . Prostate cancer Neg Hx     Social History   Tobacco Use  . Smoking status: Current Every Day Smoker    Packs/day: 0.50    Years: 30.00    Pack years: 15.00    Types: Cigarettes  . Smokeless tobacco: Never Used  Substance Use Topics  . Alcohol use: No  . Drug use: No    Review of Systems:   Review of Systems  Constitutional: Negative for chills, fever, malaise/fatigue and weight loss.  HENT: Negative for hearing loss, sinus pain and sore throat.   Eyes: Negative for blurred vision.  Respiratory: Negative for cough and shortness of breath.   Cardiovascular: Negative for chest pain, palpitations and leg swelling.  Gastrointestinal: Negative for abdominal pain, constipation, diarrhea, heartburn, nausea and vomiting.  Genitourinary: Negative for dysuria, frequency and urgency.  Musculoskeletal: Negative for back pain, myalgias and neck pain.  Skin: Negative for itching  and rash.  Neurological: Negative for dizziness, tingling, seizures, loss of consciousness and headaches.  Endo/Heme/Allergies: Negative for polydipsia.  Psychiatric/Behavioral: Negative for depression. The patient is not nervous/anxious.   All other systems reviewed and are negative.   Objective:   Vitals:   06/27/19 1456 06/27/19 1555  BP: (!) 170/110 (!) 164/110  Pulse: 72 68  Temp: 98.3 F (36.8 C)   SpO2: 96%    Body mass index is 37.2 kg/m.  General Appearance:  Alert, cooperative, no  distress, appears stated age  Head:  Normocephalic, without obvious abnormality, atraumatic  Eyes:  PERRL, conjunctiva/corneas clear, EOM's intact, fundi benign, both eyes       Ears:  Normal TM's and external ear canals, both ears  Nose: Nares normal, septum midline, mucosa normal, no drainage    or sinus tenderness  Throat: Lips, mucosa, and tongue normal; teeth and gums normal  Neck: Supple, symmetrical, trachea midline, no adenopathy; thyroid:  No enlargement/tenderness/nodules; no carotit bruit or JVD  Back:   Symmetric, no curvature, ROM normal, no CVA tenderness  Lungs:   Clear to auscultation bilaterally, respirations unlabored  Chest wall:  No tenderness or deformity  Heart:  Regular rate and rhythm, S1 and S2 normal, no murmur, rub   or gallop  Abdomen:   Soft, non-tender, bowel sounds active all four quadrants, no masses, no organomegaly  Extremities: Extremities normal, atraumatic, no cyanosis or edema  Prostate: Not done.   Skin: Skin color, texture, turgor normal, no rashes or lesions  Lymph nodes: Cervical, supraclavicular, and axillary nodes normal  Neurologic: CNII-XII grossly intact. Normal strength, sensation and reflexes throughout    Assessment/Plan:   Robert Pratt was seen today for annual exam.  Diagnoses and all orders for this visit:  Routine physical examination Today patient counseled on age appropriate routine health concerns for screening and prevention, each reviewed and up to date or declined. Immunizations reviewed and up to date or declined. Labs ordered and reviewed. Risk factors for depression reviewed and negative. Hearing function and visual acuity are intact. ADLs screened and addressed as needed. Functional ability and level of safety reviewed and appropriate. Education, counseling and referrals performed based on assessed risks today. Patient provided with a copy of personalized plan for preventive services.  Essential hypertension Increase  lisinopril-hydrochlorothiazide to 20-25 mg tablet daily.  Continue Norvasc 10 mg daily.  Follow-up in 1 month, sooner if concerns.  We are considering a sleep study, however he is not ready at this time.  Tobacco abuse Chantix prescribed.  Side effects discussed with patient, he accepted side effects and understands risks of taking this medication.  Hyperlipidemia, unspecified hyperlipidemia type We will recheck today, recalculate ASCVD, and then determine plan for statin. -     Lipid panel  Pre-diabetes/Obesity Likely uncontrolled, repeat hemoglobin A1c.  He is agreeable to Ozempic if needed.  I think this would be a great option for him for weight loss.  Follow-up in 3 months. -     CBC with Differential/Platelet -     Comprehensive metabolic panel -     Hemoglobin A1c  Other orders -     varenicline (CHANTIX STARTING MONTH PAK) 0.5 MG X 11 & 1 MG X 42 tablet; Take one 0.5 mg tablet by mouth once daily for 3 days, then increase to one 0.5 mg tablet twice daily for 4 days, then increase to one 1 mg tablet twice daily. -     amLODipine (NORVASC) 10 MG tablet; Take 1 tablet (  10 mg total) by mouth daily. -     lisinopril-hydrochlorothiazide (ZESTORETIC) 20-25 MG tablet; Take 1 tablet by mouth daily.    Well Adult Exam: Labs ordered: Yes. Patient counseling was done. See below for items discussed. Discussed the patient's BMI.  The BMI is not in the acceptable range; BMI management plan is completed Follow up in 1 months.  Patient Counseling: [x]   Nutrition: Stressed importance of moderation in sodium/caffeine intake, saturated fat and cholesterol, caloric balance, sufficient intake of fresh fruits, vegetables, and fiber.  [x]   Stressed the importance of regular exercise.   []   Substance Abuse: Discussed cessation/primary prevention of tobacco, alcohol, or other drug use; driving or other dangerous activities under the influence; availability of treatment for abuse.   [x]   Injury prevention:  Discussed safety belts, safety helmets, smoke detector, smoking near bedding or upholstery.   []   Sexuality: Discussed sexually transmitted diseases, partner selection, use of condoms, avoidance of unintended pregnancy  and contraceptive alternatives.   [x]   Dental health: Discussed importance of regular tooth brushing, flossing, and dental visits.  [x]   Health maintenance and immunizations reviewed. Please refer to Health maintenance section.    CMA or LPN served as scribe during this visit. History, Physical, and Plan performed by medical provider. The above documentation has been reviewed and is accurate and complete.   Robert Coke, PA-C [place]

## 2019-06-27 NOTE — Patient Instructions (Addendum)
It was great to see you!  Please go to the lab for blood work.   Our office will call you with your results unless you have chosen to receive results via MyChart.  If your blood work is normal we will follow-up each year for physicals and as scheduled for chronic medical problems.  If anything is abnormal we will treat accordingly and get you in for a follow-up.  Chantix for smoking cessation: Initial Chantix instructions: Set a quit date at least 7 days in the future. You should continue smoking for at least 7 days while taking the Chantix. Take 1/2 tablet once daily for 4 days, then 1/2 tablet twice daily for 4 days, then 1/2 tablet each morning and 1 whole tablet each evening for 4 days, then take 1 whole tablet twice daily.  I will be in touch regarding starting Ozempic (injectable for blood sugar/weight loss) and your cholesterol medication.  I have increased your blood pressure medication. Continue Norvasc 10 mg daily Now take Lisinopril 20 mg - Hydrochlorothiazide 25 mg (this has been sent in for you)   Follow-up in 3 MONTHS.  Take care,  [facility] Maintenance, Male Adopting a healthy lifestyle and getting preventive care are important in promoting health and wellness. Ask your health care provider about:  The right schedule for you to have regular tests and exams.  Things you can do on your own to prevent diseases and keep yourself healthy. What should I know about diet, weight, and exercise? Eat a healthy diet   Eat a diet that includes plenty of vegetables, fruits, low-fat dairy products, and lean protein.  Do not eat a lot of foods that are high in solid fats, added sugars, or sodium. Maintain a healthy weight Body mass index (BMI) is a measurement that can be used to identify possible weight problems. It estimates body fat based on height and weight. Your health care provider can help determine your BMI and help you achieve or maintain a healthy  weight. Get regular exercise Get regular exercise. This is one of the most important things you can do for your health. Most adults should:  Exercise for at least 150 minutes each week. The exercise should increase your heart rate and make you sweat (moderate-intensity exercise).  Do strengthening exercises at least twice a week. This is in addition to the moderate-intensity exercise.  Spend less time sitting. Even light physical activity can be beneficial. Watch cholesterol and blood lipids Have your blood tested for lipids and cholesterol at 48 years of age, then have this test every 5 years. You may need to have your cholesterol levels checked more often if:  Your lipid or cholesterol levels are high.  You are older than 48 years of age.  You are at high risk for heart disease. What should I know about cancer screening? Many types of cancers can be detected early and may often be prevented. Depending on your health history and family history, you may need to have cancer screening at various ages. This may include screening for:  Colorectal cancer.  Prostate cancer.  Skin cancer.  Lung cancer. What should I know about heart disease, diabetes, and high blood pressure? Blood pressure and heart disease  High blood pressure causes heart disease and increases the risk of stroke. This is more likely to develop in people who have high blood pressure readings, are of African descent, or are overweight.  Talk with your health care provider about your  target blood pressure readings.  Have your blood pressure checked: ? Every 3-5 years if you are 55-35 years of age. ? Every year if you are 69 years old or older.  If you are between the ages of 33 and 15 and are a current or former smoker, ask your health care provider if you should have a one-time screening for abdominal aortic aneurysm (AAA). Diabetes Have regular diabetes screenings. This checks your fasting blood sugar level. Have  the screening done:  Once every three years after age 68 if you are at a normal weight and have a low risk for diabetes.  More often and at a younger age if you are overweight or have a high risk for diabetes. What should I know about preventing infection? Hepatitis B If you have a higher risk for hepatitis B, you should be screened for this virus. Talk with your health care provider to find out if you are at risk for hepatitis B infection. Hepatitis C Blood testing is recommended for:  Everyone born from [birth year] through 1965.  Anyone with known risk factors for hepatitis C. Sexually transmitted infections (STIs)  You should be screened each year for STIs, including gonorrhea and chlamydia, if: ? You are sexually active and are younger than 48 years of age. ? You are older than 48 years of age and your health care provider tells you that you are at risk for this type of infection. ? Your sexual activity has changed since you were last screened, and you are at increased risk for chlamydia or gonorrhea. Ask your health care provider if you are at risk.  Ask your health care provider about whether you are at high risk for HIV. Your health care provider may recommend a prescription medicine to help prevent HIV infection. If you choose to take medicine to prevent HIV, you should first get tested for HIV. You should then be tested every 3 months for as long as you are taking the medicine. Follow these instructions at home: Lifestyle  Do not use any products that contain nicotine or tobacco, such as cigarettes, e-cigarettes, and chewing tobacco. If you need help quitting, ask your health care provider.  Do not use street drugs.  Do not share needles.  Ask your health care provider for help if you need support or information about quitting drugs. Alcohol use  Do not drink alcohol if your health care provider tells you not to drink.  If you drink alcohol: ? Limit how much you have to 0-2  drinks a day. ? Be aware of how much alcohol is in your drink. In the [place] one drink equals one 12 oz bottle of beer (355 mL), one 5 oz glass of wine (148 mL), or one 1 oz glass of hard liquor (44 mL). General instructions  Schedule regular health, dental, and eye exams.  Stay current with your vaccines.  Tell your health care provider if: ? You often feel depressed. ? You have ever been abused or do not feel safe at home. Summary  Adopting a healthy lifestyle and getting preventive care are important in promoting health and wellness.  Follow your health care provider's instructions about healthy diet, exercising, and getting tested or screened for diseases.  Follow your health care provider's instructions on monitoring your cholesterol and blood pressure. This information is not intended to replace advice given to you by your health care provider. Make sure you discuss any questions you have with your health care provider. Document  Released: 03/11/2008 Document Revised: 09/06/2018 Document Reviewed: 09/06/2018 Elsevier Patient Education  2020 [employer].

## 2019-06-28 LAB — COMPREHENSIVE METABOLIC PANEL
ALT: 14 U/L (ref 0–53)
AST: 16 U/L (ref 0–37)
Albumin: 4.2 g/dL (ref 3.5–5.2)
Alkaline Phosphatase: 50 U/L (ref 39–117)
BUN: 13 mg/dL (ref 6–23)
CO2: 27 mEq/L (ref 19–32)
Calcium: 9.3 mg/dL (ref 8.4–10.5)
Chloride: 101 mEq/L (ref 96–112)
Creatinine, Ser: 0.93 mg/dL (ref 0.40–1.50)
GFR: 104.83 mL/min (ref >60.00)
Glucose, Bld: 97 mg/dL (ref 70–99)
Potassium: 4 mEq/L (ref 3.5–5.1)
Sodium: 136 mEq/L (ref 135–145)
Total Bilirubin: 0.5 mg/dL (ref 0.2–1.2)
Total Protein: 7 g/dL (ref 6.0–8.3)

## 2019-06-28 LAB — CBC WITH DIFFERENTIAL/PLATELET
Basophils Absolute: 0 10*3/uL (ref 0.0–0.1)
Basophils Relative: 0.8 % (ref 0.0–3.0)
Eosinophils Absolute: 0.3 10*3/uL (ref 0.0–0.7)
Eosinophils Relative: 4.6 % (ref 0.0–5.0)
HCT: 46.2 % (ref 39.0–52.0)
Hemoglobin: 15.2 g/dL (ref 13.0–17.0)
Lymphocytes Relative: 38.4 % (ref 12.0–46.0)
Lymphs Abs: 2.1 10*3/uL (ref 0.7–4.0)
MCHC: 33 g/dL (ref 30.0–36.0)
MCV: 89.9 fl (ref 78.0–100.0)
Monocytes Absolute: 0.3 10*3/uL (ref 0.1–1.0)
Monocytes Relative: 6 % (ref 3.0–12.0)
Neutro Abs: 2.7 10*3/uL (ref 1.4–7.7)
Neutrophils Relative %: 50.2 % (ref 43.0–77.0)
Platelets: 268 10*3/uL (ref 150.0–400.0)
RBC: 5.14 Mil/uL (ref 4.22–5.81)
RDW: 13.2 % (ref 11.5–15.5)
WBC: 5.4 10*3/uL (ref 4.0–10.5)

## 2019-06-28 LAB — LIPID PANEL
Cholesterol: 158 mg/dL (ref 0–200)
HDL: 30.9 mg/dL — ABNORMAL LOW (ref >39.00)
LDL Cholesterol: 103 mg/dL — ABNORMAL HIGH (ref 0–99)
NonHDL: 126.81
Total CHOL/HDL Ratio: 5
Triglycerides: 117 mg/dL (ref 0.0–149.0)
VLDL: 23.4 mg/dL (ref 0.0–40.0)

## 2019-06-28 LAB — HEMOGLOBIN A1C: Hgb A1c MFr Bld: 6.8 % — ABNORMAL HIGH (ref 4.6–6.5)

## 2019-06-29 ENCOUNTER — Other Ambulatory Visit: Payer: Self-pay | Admitting: Physician Assistant

## 2019-06-29 MED ORDER — METFORMIN HCL 500 MG PO TABS: 500.0000 mg | ORAL_TABLET | Freq: Every day | ORAL | 1 refills | Status: DC

## 2019-06-29 MED ORDER — ATORVASTATIN CALCIUM 40 MG PO TABS: 40.0000 mg | ORAL_TABLET | Freq: Every day | ORAL | 1 refills | Status: DC

## 2019-07-04 ENCOUNTER — Telehealth: Payer: Self-pay

## 2019-07-04 NOTE — Telephone Encounter (Signed)
Copied from [place] [id number]. Topic: General - Call Back - No Documentation >> Jul 04, 2019 11:41 AM Yvette Rack wrote: Reason for CRM: Pt stated he had a missed call from the office. Pt requests call back

## 2019-07-04 NOTE — Telephone Encounter (Signed)
See result notes. 

## 2019-07-05 ENCOUNTER — Telehealth: Payer: Self-pay

## 2019-07-05 NOTE — Telephone Encounter (Signed)
Copied from [place] [id number]. Topic: General - Other >> Jul 05, 2019 10:02 AM Pauline Good wrote: Reason for CRM: pt want to do a virtual visit for his appt on 10.14.20 to show him how to administer a shot. please call pt to advise

## 2019-07-05 NOTE — Telephone Encounter (Signed)
Spoke to pt told him can not do virtual because we have to give you a sample of the medication she wants to start you on. Pt verbalized understanding and said he can not get here early for NV on 10/14. Asked him what time he can be here? Pt said 4:30. Told pt okay will change visit and I will let Anderson Malta one of the other nurses know that you are coming late. Pt verbalized understanding. Appt time changed.

## 2019-07-11 ENCOUNTER — Other Ambulatory Visit: Payer: Self-pay

## 2019-07-11 ENCOUNTER — Ambulatory Visit: Payer: BC Managed Care – PPO

## 2019-07-11 DIAGNOSIS — R7303 Prediabetes: Secondary | ICD-10-CM

## 2019-07-11 MED ORDER — OZEMPIC (0.25 OR 0.5 MG/DOSE) 2 MG/1.5ML ~~LOC~~ SOPN: 0.5000 mg | PEN_INJECTOR | SUBCUTANEOUS | 0 refills | Status: DC

## 2019-07-11 NOTE — Progress Notes (Signed)
Patient education given on Ozempic pen use, and the patient expresses understanding and acceptance of instructions. [place], [place] 07/11/2019 4:48 PM

## 2019-07-25 ENCOUNTER — Other Ambulatory Visit: Payer: Self-pay | Admitting: *Deleted

## 2019-07-25 MED ORDER — VARENICLINE TARTRATE 1 MG PO TABS: 1.0000 mg | ORAL_TABLET | Freq: Two times a day (BID) | ORAL | 0 refills | Status: DC

## 2019-08-03 ENCOUNTER — Other Ambulatory Visit: Payer: Self-pay

## 2019-08-03 ENCOUNTER — Encounter: Payer: Self-pay | Admitting: Physician Assistant

## 2019-08-03 ENCOUNTER — Ambulatory Visit (INDEPENDENT_AMBULATORY_CARE_PROVIDER_SITE_OTHER): Payer: BC Managed Care – PPO | Admitting: Physician Assistant

## 2019-08-03 VITALS — BP 130/80 | HR 67 | Temp 98.2°F | Ht 67.0 in | Wt 236.0 lb

## 2019-08-03 DIAGNOSIS — Z720 Tobacco use: Secondary | ICD-10-CM | POA: Diagnosis not present

## 2019-08-03 DIAGNOSIS — I10 Essential (primary) hypertension: Secondary | ICD-10-CM | POA: Diagnosis not present

## 2019-08-03 DIAGNOSIS — Z23 Encounter for immunization: Secondary | ICD-10-CM

## 2019-08-03 DIAGNOSIS — E119 Type 2 diabetes mellitus without complications: Secondary | ICD-10-CM | POA: Diagnosis not present

## 2019-08-03 DIAGNOSIS — E785 Hyperlipidemia, unspecified: Secondary | ICD-10-CM | POA: Insufficient documentation

## 2019-08-03 MED ORDER — OZEMPIC (0.25 OR 0.5 MG/DOSE) 2 MG/1.5ML ~~LOC~~ SOPN: 0.5000 mg | PEN_INJECTOR | SUBCUTANEOUS | 2 refills | Status: DC

## 2019-08-03 NOTE — Patient Instructions (Signed)
It was great to see you!              I am SO PROUD of your progress! Keep it up!  My new goal for you is to spend 30 minutes on your exercise bike twice a week.  Let's follow-up in January, sooner if you have concerns.  Take care,  Inda Coke PA-C

## 2019-08-03 NOTE — Progress Notes (Signed)
Robert Pratt is a 48 y.o. male here for a follow up of a pre-existing problem.  History of Present Illness:   Chief Complaint  Patient presents with  . Hypertension    HPI   HTN -- Currently taking lisinopril 20-hydrochlorothiazide 25 mg and amlodipine 10 mg.  At his last visit we had increased his lisinopril 10-hydrochlorothiazide 12.5 mg to the current dosage.  He is maintaining his amlodipine 10 mg.  At home blood pressure readings are: not checked. Patient denies chest pain, SOB, blurred vision, dizziness, unusual headaches, lower leg swelling. Patient is compliant with medication. Denies excessive caffeine intake, stimulant usage, excessive alcohol intake, or increase in salt consumption.  BP Readings from Last 3 Encounters:  08/03/19 130/80  06/27/19 (!) 164/110  07/24/18 126/84   Diabetes  He is currently on metformin 500 mg daily and Ozempic 0.5 mg weekly.  Diabetic Review of Systems - medication compliance: compliant all of the time, diabetic diet compliance: compliant all of the time, home glucose monitoring: is not performed, further diabetic ROS: no polyuria or polydipsia, no chest pain, dyspnea or TIA's, no numbness, tingling or pain in extremities, no unusual visual symptoms, no hypoglycemia, no medication side effects noted, last eye exam is not UTD .  Tobacco abuse At the last visit we discussed starting Chantix.  He has started the Chantix and he is doing very well with this medication.  He is only had half of a cigarette today.  Niece thinks that he is currently on his last pack of cigarettes.  He started smoking at age 32 and has never made this much progress with smoking cessation.  He is very encouraged.  Past Medical History:  Diagnosis Date  . Hypertension      Social History   Socioeconomic History  . Marital status: Married    Spouse name: Not on file  . Number of children: Not on file  . Years of education: Not on file  . Highest education level: Not  on file  Occupational History  . Not on file  Social Needs  . Financial resource strain: Not on file  . Food insecurity    Worry: Not on file    Inability: Not on file  . Transportation needs    Medical: Not on file    Non-medical: Not on file  Tobacco Use  . Smoking status: Current Every Day Smoker    Packs/day: 0.50    Years: 30.00    Pack years: 15.00    Types: Cigarettes  . Smokeless tobacco: Never Used  Substance and Sexual Activity  . Alcohol use: No  . Drug use: No  . Sexual activity: Yes  Lifestyle  . Physical activity    Days per week: Not on file    Minutes per session: Not on file  . Stress: Not on file  Relationships  . Social Herbalist on phone: Not on file    Gets together: Not on file    Attends religious service: Not on file    Active member of club or organization: Not on file    Attends meetings of clubs or organizations: Not on file    Relationship status: Not on file  . Intimate partner violence    Fear of current or ex partner: Not on file    Emotionally abused: Not on file    Physically abused: Not on file    Forced sexual activity: Not on file  Other Topics Concern  .  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 No pertinent surgical history.  Family History  Problem Relation Age of Onset  . Diabetes Mother   . Hypertension Mother   . Cancer Father        bone, liver  . Diabetes Father   . Hypertension Father   . Prostate cancer Neg Hx     No Known Allergies  Current Medications:   Current Outpatient Medications:  .  amLODipine (NORVASC) 10 MG tablet, Take 1 tablet (10 mg total) by mouth daily., Disp: 90 tablet, Rfl: 1 .  atorvastatin (LIPITOR) 40 MG tablet, Take 1 tablet (40 mg total) by mouth daily., Disp: 90 tablet, Rfl: 1 .  ibuprofen (ADVIL,MOTRIN) 200 MG tablet, Take 400-600 mg by mouth every 6 (six) hours as needed., Disp: , Rfl:  .   lisinopril-hydrochlorothiazide (ZESTORETIC) 20-25 MG tablet, Take 1 tablet by mouth daily., Disp: 90 tablet, Rfl: 1 .  metFORMIN (GLUCOPHAGE) 500 MG tablet, Take 1 tablet (500 mg total) by mouth daily with breakfast., Disp: 90 tablet, Rfl: 1 .  Semaglutide,0.25 or 0.5MG /DOS, (OZEMPIC, 0.25 OR 0.5 MG/DOSE,) 2 MG/1.5ML SOPN, Inject 0.5 mg into the skin once a week., Disp: 1 pen, Rfl: 2 .  varenicline (CHANTIX STARTING MONTH PAK) 0.5 MG X 11 & 1 MG X 42 tablet, Take one 0.5 mg tablet by mouth once daily for 3 days, then increase to one 0.5 mg tablet twice daily for 4 days, then increase to one 1 mg tablet twice daily., Disp: 53 tablet, Rfl: 0 .  varenicline (CHANTIX) 1 MG tablet, Take 1 tablet (1 mg total) by mouth 2 (two) times daily., Disp: 60 tablet, Rfl: 0   Review of Systems:   ROS  Negative unless otherwise specified per HPI.   Vitals:   Vitals:   08/03/19 1501  BP: 130/80  Pulse: 67  Temp: 98.2 F (36.8 C)  TempSrc: Temporal  SpO2: 97%  Weight: 236 lb (107 kg)  Height: 5\' 7"  (1.702 m)     Body mass index is 36.96 kg/m.  Physical Exam:   Physical Exam Vitals signs and nursing note reviewed.  Constitutional:      General: He is not in acute distress.    Appearance: He is well-developed. He is not ill-appearing or toxic-appearing.  Cardiovascular:     Rate and Rhythm: Normal rate and regular rhythm.     Pulses: Normal pulses.     Heart sounds: Normal heart sounds, S1 normal and S2 normal.     Comments: No LE edema Pulmonary:     Effort: Pulmonary effort is normal.     Breath sounds: Normal breath sounds.  Skin:    General: Skin is warm and dry.  Neurological:     Mental Status: He is alert.     GCS: GCS eye subscore is 4. GCS verbal subscore is 5. GCS motor subscore is 6.  Psychiatric:        Speech: Speech normal.        Behavior: Behavior normal. Behavior is cooperative.     Assessment and Plan:   Robert Pratt was seen today for hypertension.  Diagnoses and all  orders for this visit:  Essential hypertension Currently well controlled, will continue lisinopril 20-hydrochlorothiazide 25 mg and Norvasc 10 mg daily.  I do anticipate with smoking reduction and weight loss, that we may be able to decrease his medications.  Follow-up in January, sooner  if concerns.  Diabetes mellitus without complication ([place]) He is doing great with Ozempic 0.5 mg weekly and Metformin 500 mg daily, okay to continue.  Follow-up when A1c is due in January, sooner if concerns.  Tobacco abuse Continue smoking cessation.  Patient is doing excellent.  Need for immunization against influenza -     Flu Vaccine QUAD 36+ mos IM  Need for prophylactic vaccination against Streptococcus pneumoniae (pneumococcus) -     Pneumococcal polysaccharide vaccine 23-valent greater than or equal to 2yo subcutaneous/IM  Other orders -     Semaglutide,0.25 or 0.5MG /DOS, (OZEMPIC, 0.25 OR 0.5 MG/DOSE,) 2 MG/1.5ML SOPN; Inject 0.5 mg into the skin once a week.   . Reviewed expectations re: course of current medical issues. . Discussed self-management of symptoms. . Outlined signs and symptoms indicating need for more acute intervention. . Patient verbalized understanding and all questions were answered. . See orders for this visit as documented in the electronic medical record. . Patient received an After-Visit Summary.  CMA or LPN served as scribe during this visit. History, Physical, and Plan performed by medical provider. The above documentation has been reviewed and is accurate and complete.   Inda Coke, PA-C

## 2019-08-24 ENCOUNTER — Other Ambulatory Visit: Payer: Self-pay | Admitting: Physician Assistant

## 2019-09-18 ENCOUNTER — Other Ambulatory Visit: Payer: Self-pay | Admitting: Physician Assistant

## 2019-09-18 MED ORDER — VARENICLINE TARTRATE 1 MG PO TABS: 1.0000 mg | ORAL_TABLET | Freq: Two times a day (BID) | ORAL | 0 refills | Status: DC

## 2019-10-15 ENCOUNTER — Other Ambulatory Visit: Payer: Self-pay | Admitting: Physician Assistant

## 2019-10-17 LAB — HM DIABETES EYE EXAM

## 2019-10-25 ENCOUNTER — Other Ambulatory Visit: Payer: Self-pay

## 2019-10-26 ENCOUNTER — Ambulatory Visit (INDEPENDENT_AMBULATORY_CARE_PROVIDER_SITE_OTHER): Payer: BC Managed Care – PPO | Admitting: Physician Assistant

## 2019-10-26 ENCOUNTER — Encounter: Payer: Self-pay | Admitting: Physician Assistant

## 2019-10-26 VITALS — BP 152/100 | HR 63 | Temp 98.1°F | Ht 67.0 in | Wt 238.0 lb

## 2019-10-26 DIAGNOSIS — I10 Essential (primary) hypertension: Secondary | ICD-10-CM | POA: Diagnosis not present

## 2019-10-26 DIAGNOSIS — E119 Type 2 diabetes mellitus without complications: Secondary | ICD-10-CM

## 2019-10-26 DIAGNOSIS — Z720 Tobacco use: Secondary | ICD-10-CM

## 2019-10-26 NOTE — Progress Notes (Signed)
Robert Pratt is a 49 y.o. male is here to discuss:Hypertension  I acted as a Education administrator for [employer], PA-C [name], LPN  History of Present Illness:   Chief Complaint  Patient presents with  . Hypertension    HPI   Tobacco cessation Doing really well, smoke free for two weeks. Trialed chantix but had "issues in the bedroom."   Hypertension Pt following up today on blood pressure. Does not check at home. Currently taking Amlodipine 10 mg daily, Lisinopril-HCTZ 20-25 mg daily. Pt denies headaches, dizziness, blurred vision, chest pain, SOB or lower leg edema. Denies excessive caffeine intake, stimulant usage, excessive alcohol intake or increase in salt consumption.   Diabetes 3 month follow-up. Current DM meds: 500 mg metformin daily. Was given Ozempic sample and did well with this, but never picked up the rx to start. Blood sugars at home are: not checked. Patient is compliant with medications. Denies: hypoglycemic or hyperglycemic episodes or symptoms. .  Lab Results  Component Value Date   HGBA1C 6.8 (H) 06/27/2019   Wt Readings from Last 5 Encounters:  10/26/19 238 lb (108 kg)  08/03/19 236 lb (107 kg)  06/27/19 237 lb 8 oz (107.7 kg)  07/24/18 224 lb 9.6 oz (101.9 kg)  07/07/18 230 lb (104.3 kg)     There are no preventive care reminders to display for this patient.  Past Medical History:  Diagnosis Date  . Hypertension      Social History   Socioeconomic History  . Marital status: Married    Spouse name: Not on file  . Number of children: Not on file  . Years of education: Not on file  . Highest education level: Not on file  Occupational History  . Not on file  Tobacco Use  . Smoking status: Former Smoker    Packs/day: 0.50    Years: 30.00    Pack years: 15.00    Types: Cigarettes    Quit date: 10/12/2019    Years since quitting: 0.0  . Smokeless tobacco: Never Used  Substance and Sexual Activity  . Alcohol use: No  . Drug use: No    . Sexual activity: Yes  Other Topics Concern  . 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 Difficulty of Paying Living Expenses: Not on file  Food Insecurity:   . Worried About Charity fundraiser in the Last Year: Not on file  . Ran Out of Food in the Last Year: Not on file  Transportation Needs:   . Lack of Transportation (Medical): Not on file  . Lack of Transportation (Non-Medical): Not on file  Physical Activity:   . Days of Exercise per Week: Not on file  . Minutes of Exercise per Session: Not on file  Stress:   . Feeling of Stress : Not on file  Social Connections:   . Frequency of Communication with Friends and Family: Not on file  . Frequency of Social Gatherings with Friends and Family: Not on file  . Attends Religious Services: Not on file  . Active Member of Clubs or Organizations: Not on file  . Attends Archivist Meetings: Not on file  . Marital Status: Not on file  Intimate Partner Violence:   . Fear of Current or Ex-Partner: Not on file  . Emotionally Abused: Not on file  . Physically Abused:  Not on file  . Sexually Abused: Not on file    History reviewed. No pertinent surgical history.  Family History  Problem Relation Age of Onset  . Diabetes Mother   . Hypertension Mother   . Cancer Father        bone, liver  . Diabetes Father   . Hypertension Father   . Prostate cancer Neg Hx     PMHx, SurgHx, SocialHx, FamHx, Medications, and Allergies were reviewed in the Visit Navigator and updated as appropriate.   Patient Active Problem List   Diagnosis Date Noted  . Diabetes mellitus without complication ([place]) 123456  . Hyperlipidemia 08/03/2019  . Hypertension 04/15/2017  . Obesity (BMI 30-39.9) 04/15/2017    Social History   Tobacco Use  . Smoking status: Former Smoker    Packs/day: 0.50    Years: 30.00    Pack years: 15.00     Types: Cigarettes    Quit date: 10/12/2019    Years since quitting: 0.0  . Smokeless tobacco: Never Used  Substance Use Topics  . Alcohol use: No  . Drug use: No    Current Medications and Allergies:    Current Outpatient Medications:  .  amLODipine (NORVASC) 10 MG tablet, Take 1 tablet (10 mg total) by mouth daily., Disp: 90 tablet, Rfl: 1 .  atorvastatin (LIPITOR) 40 MG tablet, Take 1 tablet (40 mg total) by mouth daily., Disp: 90 tablet, Rfl: 1 .  ibuprofen (ADVIL,MOTRIN) 200 MG tablet, Take 400-600 mg by mouth every 6 (six) hours as needed., Disp: , Rfl:  .  lisinopril-hydrochlorothiazide (ZESTORETIC) 20-25 MG tablet, Take 1 tablet by mouth daily., Disp: 90 tablet, Rfl: 1 .  metFORMIN (GLUCOPHAGE) 500 MG tablet, Take 1 tablet (500 mg total) by mouth daily with breakfast., Disp: 90 tablet, Rfl: 1 .  Semaglutide,0.25 or 0.5MG /DOS, (OZEMPIC, 0.25 OR 0.5 MG/DOSE,) 2 MG/1.5ML SOPN, Inject 0.5 mg into the skin once a week., Disp: 1 pen, Rfl: 2  No Known Allergies  Review of Systems   ROS  Negative unless otherwise specified per HPI.  Vitals:   Vitals:   10/26/19 1501  BP: (!) 152/100  Pulse: 63  Temp: 98.1 F (36.7 C)  TempSrc: Temporal  SpO2: 96%  Weight: 238 lb (108 kg)  Height: 5\' 7"  (1.702 m)     Body mass index is 37.28 kg/m.   Physical Exam:    Physical Exam Vitals and nursing note reviewed.  Constitutional:      General: He is not in acute distress.    Appearance: He is well-developed. He is not ill-appearing or toxic-appearing.  Cardiovascular:     Rate and Rhythm: Normal rate and regular rhythm.     Pulses: Normal pulses.     Heart sounds: Normal heart sounds, S1 normal and S2 normal.     Comments: No LE edema Pulmonary:     Effort: Pulmonary effort is normal.     Breath sounds: Normal breath sounds.  Skin:    General: Skin is warm and dry.  Neurological:     Mental Status: He is alert.     GCS: GCS eye subscore is 4. GCS verbal subscore is 5.  GCS motor subscore is 6.  Psychiatric:        Speech: Speech normal.        Behavior: Behavior normal. Behavior is cooperative.      Assessment and Plan:    Raquel was seen today for hypertension.  Diagnoses and all orders for this  visit:  Diabetes mellitus without complication ([place]) Will update HgbA1c and assess control. May need to add back Ozempic, which he is agreeable to doing. Continue efforts in trying to start up an exercise program. -     Hemoglobin A1c  Essential hypertension Difficult to assess control because he did not take this medications today. He is without symptoms. Will continue current regimen and have him follow-up in 3 months, sooner if he has any symptoms. Did encourage patient to obtain home BP cuff so he can assess at home. Also, I have strong suspicion for sleep apnea -- he plans to discuss with wife. -     Comprehensive metabolic panel  Tobacco abuse Commended efforts. Continue to work towards ongoing smoking cessation.   . Reviewed expectations re: course of current medical issues. . Discussed self-management of symptoms. . Outlined signs and symptoms indicating need for more acute intervention. . Patient verbalized understanding and all questions were answered. . See orders for this visit as documented in the electronic medical record. . Patient received an After Visit Summary.  CMA or LPN served as scribe during this visit. History, Physical, and Plan performed by medical provider. The above documentation has been reviewed and is accurate and complete.   Inda Coke, PA-C [place] [place] 10/26/2019  Follow-up: No follow-ups on file.

## 2019-10-26 NOTE — Patient Instructions (Signed)
It was great to see you!  We will be in touch about your labs!  Please talk to your wife about my concerns of you may having sleep apnea --  Sleep apnea is a condition in which breathing pauses or becomes shallow during sleep. Sleep apnea is most commonly caused by a collapsed or blocked airway. People with sleep apnea snore loudly and have times when they gasp and stop breathing for 10 seconds or more during sleep. This happens over and over during the night. This disrupts your sleep and keeps your body from getting the rest that it needs, which can cause tiredness and lack of energy (fatigue) during the day.  The breaks in breathing also interrupt the deep sleep that you need to feel rested. Even if you do not completely wake up from the gaps in breathing, your sleep may not be restful. You may also have a headache in the morning and low energy during the day, and you may feel anxious or depressed.  Let's follow-up in 3 months, sooner if you have concerns.  Take care,  Inda Coke PA-C

## 2019-10-27 LAB — COMPREHENSIVE METABOLIC PANEL
AG Ratio: 1.5 (calc) (ref 1.0–2.5)
ALT: 24 U/L (ref 9–46)
AST: 39 U/L (ref 10–40)
Albumin: 4.3 g/dL (ref 3.6–5.1)
Alkaline phosphatase (APISO): 55 U/L (ref 36–130)
BUN: 11 mg/dL (ref 7–25)
CO2: 28 mmol/L (ref 20–32)
Calcium: 9.3 mg/dL (ref 8.6–10.3)
Chloride: 104 mmol/L (ref 98–110)
Creat: 1.01 mg/dL (ref 0.60–1.35)
Globulin: 2.8 g/dL (calc) (ref 1.9–3.7)
Glucose, Bld: 109 mg/dL — ABNORMAL HIGH (ref 65–99)
Potassium: 4 mmol/L (ref 3.5–5.3)
Sodium: 140 mmol/L (ref 135–146)
Total Bilirubin: 0.8 mg/dL (ref 0.2–1.2)
Total Protein: 7.1 g/dL (ref 6.1–8.1)

## 2019-10-27 LAB — HEMOGLOBIN A1C
Hgb A1c MFr Bld: 6.6 % of total Hgb — ABNORMAL HIGH (ref <5.7)
Mean Plasma Glucose: 143 (calc)
eAG (mmol/L): 7.9 (calc)

## 2019-10-29 ENCOUNTER — Other Ambulatory Visit: Payer: Self-pay | Admitting: Physician Assistant

## 2019-10-29 MED ORDER — METFORMIN HCL 500 MG PO TABS: 500.0000 mg | ORAL_TABLET | Freq: Two times a day (BID) | ORAL | 1 refills | Status: DC

## 2019-10-31 ENCOUNTER — Encounter: Payer: Self-pay | Admitting: Physician Assistant

## 2019-11-12 ENCOUNTER — Other Ambulatory Visit: Payer: Self-pay | Admitting: *Deleted

## 2019-11-12 MED ORDER — VARENICLINE TARTRATE 1 MG PO TABS: 1.0000 mg | ORAL_TABLET | Freq: Two times a day (BID) | ORAL | 1 refills | Status: DC

## 2019-12-11 ENCOUNTER — Telehealth: Payer: Self-pay | Admitting: Physician Assistant

## 2019-12-11 NOTE — Telephone Encounter (Signed)
Robert Pratt is calling in about his FMLA form, and states he spoke with his job and they asked for [name] to fill out the frequency and duration form -page 3- and to fill out how long he will be out of work for.

## 2019-12-12 NOTE — Telephone Encounter (Signed)
Patient called back and is scheduled for Monday at 2:30.

## 2019-12-12 NOTE — Telephone Encounter (Signed)
[place], please call pt and schedule an appt with Aldona Bar to discuss FMLA paperwork and bring a new set of paperwork with him so we can fill out correctly. Thanks

## 2019-12-12 NOTE — Telephone Encounter (Signed)
Called the patient to set up an appt to discuss FMLA paperwork patient seemed confused about why the appt was needed. Tried to transfer the call to Butch Penny patient hung up phone before she could speak to him.

## 2019-12-17 ENCOUNTER — Ambulatory Visit (INDEPENDENT_AMBULATORY_CARE_PROVIDER_SITE_OTHER): Payer: BC Managed Care – PPO | Admitting: Physician Assistant

## 2019-12-17 ENCOUNTER — Encounter: Payer: Self-pay | Admitting: Physician Assistant

## 2019-12-17 ENCOUNTER — Other Ambulatory Visit: Payer: Self-pay | Admitting: Physician Assistant

## 2019-12-17 ENCOUNTER — Other Ambulatory Visit: Payer: Self-pay

## 2019-12-17 VITALS — BP 120/86 | HR 71 | Temp 98.1°F | Ht 67.0 in | Wt 232.0 lb

## 2019-12-17 DIAGNOSIS — Z0289 Encounter for other administrative examinations: Secondary | ICD-10-CM

## 2019-12-17 NOTE — Progress Notes (Signed)
Robert Pratt is a 49 y.o. male is here to discuss: FMLA paperwork  I acted as a Education administrator for [employer], PA-C [name], LPN  History of Present Illness:   Chief Complaint  Patient presents with  . Discuss FMLA paperwork    HPI   Pt is here to discuss FMLA paperwork for him to be able to take his wife to appointments.  His wife is on HD and is planning on surgery for new HD access soon, she has had several issues with this. She also has very poor neuropathy that causes her to be unable to drive.  She has several other medical issues that require several specialists.    Health Maintenance Due  Topic Date Due  . FOOT EXAM  Never done    Past Medical History:  Diagnosis Date  . Hypertension      Social History   Socioeconomic History  . Marital status: Married    Spouse name: Not on file  . Number of children: Not on file  . Years of education: Not on file  . Highest education level: Not on file  Occupational History  . Not on file  Tobacco Use  . Smoking status: Former Smoker    Packs/day: 0.50    Years: 30.00    Pack years: 15.00    Types: Cigarettes    Quit date: 10/12/2019    Years since quitting: 0.1  . Smokeless tobacco: Never Used  Substance and Sexual Activity  . Alcohol use: No  . Drug use: No  . Sexual activity: Yes  Other Topics Concern  . 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 Difficulty of Paying Living Expenses:   Food Insecurity:   . Worried About Charity fundraiser in the Last Year:   . Arboriculturist in the Last Year:   Transportation Needs:   . Film/video editor (Medical):   Marland Kitchen Lack of Transportation (Non-Medical):   Physical Activity:   . Days of Exercise per Week:   . Minutes of Exercise per Session:   Stress:   . Feeling of Stress :   Social Connections:   . Frequency of Communication with Friends  and Family:   . Frequency of Social Gatherings with Friends and Family:   . Attends Religious Services:   . Active Member of Clubs or Organizations:   . Attends Archivist Meetings:   Marland Kitchen Marital Status:   Intimate Partner Violence:   . Fear of Current or Ex-Partner:   . Emotionally Abused:   Marland Kitchen Physically Abused:   . Sexually Abused:     History reviewed. No pertinent surgical history.  Family History  Problem Relation Age of Onset  . Diabetes Mother   . Hypertension Mother   . Cancer Father        bone, liver  . Diabetes Father   . Hypertension Father   . Prostate cancer Neg Hx     PMHx, SurgHx, SocialHx, FamHx, Medications, and Allergies were reviewed in the Visit Navigator and updated as appropriate.   Patient Active Problem List   Diagnosis Date Noted  . Diabetes mellitus without complication ([place]) 123456  . Hyperlipidemia 08/03/2019  . Hypertension 04/15/2017  . Obesity (BMI 30-39.9) 04/15/2017    Social History   Tobacco Use  . Smoking status: Former Smoker  Packs/day: 0.50    Years: 30.00    Pack years: 15.00    Types: Cigarettes    Quit date: 10/12/2019    Years since quitting: 0.1  . Smokeless tobacco: Never Used  Substance Use Topics  . Alcohol use: No  . Drug use: No    Current Medications and Allergies:    Current Outpatient Medications:  .  amLODipine (NORVASC) 10 MG tablet, Take 1 tablet (10 mg total) by mouth daily., Disp: 90 tablet, Rfl: 1 .  atorvastatin (LIPITOR) 40 MG tablet, Take 1 tablet (40 mg total) by mouth daily., Disp: 90 tablet, Rfl: 1 .  ibuprofen (ADVIL,MOTRIN) 200 MG tablet, Take 400-600 mg by mouth every 6 (six) hours as needed., Disp: , Rfl:  .  lisinopril-hydrochlorothiazide (ZESTORETIC) 20-25 MG tablet, Take 1 tablet by mouth daily., Disp: 90 tablet, Rfl: 1 .  metFORMIN (GLUCOPHAGE) 500 MG tablet, Take 1 tablet (500 mg total) by mouth 2 (two) times daily with a meal., Disp: 180 tablet, Rfl: 1 .   Semaglutide,0.25 or 0.5MG /DOS, (OZEMPIC, 0.25 OR 0.5 MG/DOSE,) 2 MG/1.5ML SOPN, Inject 0.5 mg into the skin once a week., Disp: 1 pen, Rfl: 2  No Known Allergies  Review of Systems   ROS  Negative unless otherwise specified per HPI.   Vitals:   Vitals:   12/17/19 1439  BP: 120/86  Pulse: 71  Temp: 98.1 F (36.7 C)  TempSrc: Temporal  SpO2: 95%  Weight: 232 lb (105.2 kg)  Height: 5\' 7"  (1.702 m)     Body mass index is 36.34 kg/m.   Physical Exam:    Physical Exam Vitals and nursing note reviewed.  Constitutional:      Appearance: He is well-developed.  HENT:     Head: Normocephalic.  Eyes:     Conjunctiva/sclera: Conjunctivae normal.     Pupils: Pupils are equal, round, and reactive to light.  Pulmonary:     Effort: Pulmonary effort is normal.  Musculoskeletal:        General: Normal range of motion.     Cervical back: Normal range of motion.  Skin:    General: Skin is warm and dry.  Neurological:     Mental Status: He is alert and oriented to person, place, and time.  Psychiatric:        Behavior: Behavior normal.        Thought Content: Thought content normal.        Judgment: Judgment normal.      Assessment and Plan:    Arman was seen today for discuss fmla paperwork.  Diagnoses and all orders for this visit:  Encounter for completion of form with patient   FMLA paperwork filled out with patient today.  Follow-up as needed.  . Reviewed expectations re: course of current medical issues. . Discussed self-management of symptoms. . Outlined signs and symptoms indicating need for more acute intervention. . Patient verbalized understanding and all questions were answered. . See orders for this visit as documented in the electronic medical record. . Patient received an After Visit Summary.  CMA or LPN served as scribe during this visit. History, Physical, and Plan performed by medical provider. The above documentation has been reviewed and is  accurate and complete.   Inda Coke, PA-C [place] 12/17/2019  Follow-up: No follow-ups on file.

## 2019-12-26 ENCOUNTER — Other Ambulatory Visit: Payer: Self-pay | Admitting: Physician Assistant

## 2020-01-25 ENCOUNTER — Ambulatory Visit: Payer: BC Managed Care – PPO | Admitting: Physician Assistant

## 2020-01-30 ENCOUNTER — Ambulatory Visit: Payer: BC Managed Care – PPO | Admitting: Physician Assistant

## 2020-01-30 DIAGNOSIS — Z0289 Encounter for other administrative examinations: Secondary | ICD-10-CM

## 2020-01-30 NOTE — Progress Notes (Deleted)
Robert Pratt is a 49 y.o. male is here for a follow up.  I acted as a Education administrator for [employer], PA-C [employer], [place]  History of Present Illness:   No chief complaint on file.   HPI   Diabetes 3 month follow-up. Current DM meds: Metformin 1000 mg daily. Blood sugars at home are: *** - ***. Patient is *** compliant with medications. Denies: hypoglycemic or hyperglycemic episodes or symptoms. This patient's diabetes is complicated by ***.  Lab Results  Component Value Date   HGBA1C 6.6 (H) 10/26/2019     Hypertension Currently taking Norvasc 10 mg,  Lisinopril-HCTZ 20-25 mg. At home blood pressure readings are: ***. Patient denies chest pain, SOB, blurred vision, dizziness, unusual headaches, lower leg swelling. Patient is *** compliant with medication. Denies excessive caffeine intake, stimulant usage, excessive alcohol intake, or increase in salt consumption.  BP Readings from Last 3 Encounters:  12/17/19 120/86  10/26/19 (!) 152/100  08/03/19 130/80     Tobacco abuse Three months ago during our visit he was smoke free x 2 weeks. We had trialed Chantix but it caused ED issues.     Health Maintenance Due  Topic Date Due  . FOOT EXAM  Never done  . COVID-19 Vaccine (1) Never done    Past Medical History:  Diagnosis Date  . Hypertension      Social History   Socioeconomic History  . Marital status: Married    Spouse name: Not on file  . Number of children: Not on file  . Years of education: Not on file  . Highest education level: Not on file  Occupational History  . Not on file  Tobacco Use  . Smoking status: Former Smoker    Packs/day: 0.50    Years: 30.00    Pack years: 15.00    Types: Cigarettes    Quit date: 10/12/2019    Years since quitting: 0.3  . Smokeless tobacco: Never Used  Substance and Sexual Activity  . Alcohol use: No  . Drug use: No  . Sexual activity: Yes  Other Topics Concern  . 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 Difficulty of Paying Living Expenses:   Food Insecurity:   . Worried About Charity fundraiser in the Last Year:   . Arboriculturist in the Last Year:   Transportation Needs:   . Film/video editor (Medical):   Marland Kitchen Lack of Transportation (Non-Medical):   Physical Activity:   . Days of Exercise per Week:   . Minutes of Exercise per Session:   Stress:   . Feeling of Stress :   Social Connections:   . Frequency of Communication with Friends and Family:   . Frequency of Social Gatherings with Friends and Family:   . Attends Religious Services:   . Active Member of Clubs or Organizations:   . Attends Archivist Meetings:   Marland Kitchen Marital Status:   Intimate Partner Violence:   . Fear of Current or Ex-Partner:   . Emotionally Abused:   Marland Kitchen Physically Abused:   . Sexually Abused:     No past surgical history on file.  Family History  Problem Relation Age of Onset  . Diabetes Mother   . Hypertension Mother   . Cancer Father        bone, liver  .  Diabetes Father   . Hypertension Father   . Prostate cancer Neg Hx     PMHx, SurgHx, SocialHx, FamHx, Medications, and Allergies were reviewed in the Visit Navigator and updated as appropriate.   Patient Active Problem List   Diagnosis Date Noted  . Diabetes mellitus without complication ([place]) 123456  . Hyperlipidemia 08/03/2019  . Hypertension 04/15/2017  . Obesity (BMI 30-39.9) 04/15/2017    Social History   Tobacco Use  . Smoking status: Former Smoker    Packs/day: 0.50    Years: 30.00    Pack years: 15.00    Types: Cigarettes    Quit date: 10/12/2019    Years since quitting: 0.3  . Smokeless tobacco: Never Used  Substance Use Topics  . Alcohol use: No  . Drug use: No    Current Medications and Allergies:    Current Outpatient Medications:  .  amLODipine (NORVASC) 10 MG tablet, TAKE 1 TABLET BY  MOUTH EVERY DAY, Disp: 90 tablet, Rfl: 1 .  atorvastatin (LIPITOR) 40 MG tablet, TAKE 1 TABLET BY MOUTH EVERY DAY, Disp: 90 tablet, Rfl: 1 .  ibuprofen (ADVIL,MOTRIN) 200 MG tablet, Take 400-600 mg by mouth every 6 (six) hours as needed., Disp: , Rfl:  .  lisinopril-hydrochlorothiazide (ZESTORETIC) 20-25 MG tablet, TAKE 1 TABLET BY MOUTH EVERY DAY, Disp: 90 tablet, Rfl: 1 .  metFORMIN (GLUCOPHAGE) 500 MG tablet, Take 1 tablet (500 mg total) by mouth 2 (two) times daily with a meal., Disp: 180 tablet, Rfl: 1 .  Semaglutide,0.25 or 0.5MG /DOS, (OZEMPIC, 0.25 OR 0.5 MG/DOSE,) 2 MG/1.5ML SOPN, Inject 0.5 mg into the skin once a week., Disp: 1 pen, Rfl: 2  No Known Allergies  Review of Systems   ROS  Vitals:  There were no vitals filed for this visit.   There is no height or weight on file to calculate BMI.   Physical Exam:    Physical Exam   Assessment and Plan:    There are no diagnoses linked to this encounter.  . Reviewed expectations re: course of current medical issues. . Discussed self-management of symptoms. . Outlined signs and symptoms indicating need for more acute intervention. . Patient verbalized understanding and all questions were answered. . See orders for this visit as documented in the electronic medical record. . Patient received an After Visit Summary.  ***  Inda Coke, PA-C [place] 01/30/2020  Follow-up: No follow-ups on file.

## 2020-02-13 ENCOUNTER — Telehealth: Payer: BC Managed Care – PPO | Admitting: Physician Assistant

## 2020-02-26 ENCOUNTER — Other Ambulatory Visit: Payer: Self-pay | Admitting: Physician Assistant

## 2020-03-07 ENCOUNTER — Encounter: Payer: Self-pay | Admitting: Physician Assistant

## 2020-03-27 ENCOUNTER — Other Ambulatory Visit: Payer: Self-pay | Admitting: Physician Assistant

## 2020-04-23 ENCOUNTER — Other Ambulatory Visit: Payer: Self-pay

## 2020-04-23 ENCOUNTER — Ambulatory Visit (INDEPENDENT_AMBULATORY_CARE_PROVIDER_SITE_OTHER): Payer: BC Managed Care – PPO | Admitting: Physician Assistant

## 2020-04-23 ENCOUNTER — Encounter: Payer: Self-pay | Admitting: Physician Assistant

## 2020-04-23 VITALS — BP 136/78 | HR 94 | Temp 98.5°F | Ht 67.0 in | Wt 230.8 lb

## 2020-04-23 DIAGNOSIS — I10 Essential (primary) hypertension: Secondary | ICD-10-CM | POA: Diagnosis not present

## 2020-04-23 DIAGNOSIS — E119 Type 2 diabetes mellitus without complications: Secondary | ICD-10-CM | POA: Diagnosis not present

## 2020-04-23 LAB — POCT GLYCOSYLATED HEMOGLOBIN (HGB A1C): Hemoglobin A1C: 6.5 % — AB (ref 4.0–5.6)

## 2020-04-23 NOTE — Patient Instructions (Signed)
It was great to see you!  Keep up the good work! You have lost at least 8 pounds since the beginning of this year. You have stopped smoking. You have lowered your A1c from 6.6 to 6.5! I'm proud of you!  Try to do the ozempic weekly injection to see if this can help with weight loss.  Let's follow-up in 3 months for a physical and recheck your HgbA1c, sooner if you have concerns.  Take care,  Inda Coke PA-C

## 2020-04-23 NOTE — Progress Notes (Signed)
Robert Pratt is a 49 y.o. male is here to discuss: diabetes and hypertension  I acted as a Education administrator for [employer], PA-C Serita Sheller, [place]  History of Present Illness:   Chief Complaint  Patient presents with  . Hypertension  . Diabetes    HPI  DM Pt following up today on blood pressure. Checking BP at home. Readings are <140/90. Currently taking Amlodipine 10 mg daily, Lisinopril-HCTZ 20-25 mg daily. Pt denies headaches, dizziness, blurred vision, chest pain, SOB or lower leg edema. Denies excessive caffeine intake, stimulant usage, excessive alcohol intake or increase in salt consumption.   Wt Readings from Last 5 Encounters:  04/23/20 (!) 230 lb 12.8 oz (104.7 kg)  12/17/19 232 lb (105.2 kg)  10/26/19 238 lb (108 kg)  08/03/19 236 lb (107 kg)  06/27/19 237 lb 8 oz (107.7 kg)    HTN Current DM meds: 500 mg metformin daily and Ozempic once a week, but forget to use it at times. Blood sugars at home are: not checked. Patient is compliant with medications. Denies: hypoglycemic or hyperglycemic episodes or symptoms.    Smoking Cessation Pt stopped smoking in March 2021.   Health Maintenance Due  Topic Date Due  . Hepatitis C Screening  Never done  . FOOT EXAM  Never done  . COVID-19 Vaccine (1) Never done    Past Medical History:  Diagnosis Date  . Hypertension      Social History   Tobacco Use  . Smoking status: Former Smoker    Packs/day: 0.50    Years: 30.00    Pack years: 15.00    Types: Cigarettes    Quit date: 10/12/2019    Years since quitting: 0.5  . Smokeless tobacco: Never Used  Vaping Use  . Vaping Use: Never used  Substance Use Topics  . Alcohol use: No  . Drug use: No    History reviewed. No pertinent surgical history.  Family History  Problem Relation Age of Onset  . Diabetes Mother   . Hypertension Mother   . Cancer Father        bone, liver  . Diabetes Father   . Hypertension Father   . Prostate cancer Neg Hx      PMHx, SurgHx, SocialHx, FamHx, Medications, and Allergies were reviewed in the Visit Navigator and updated as appropriate.   Patient Active Problem List   Diagnosis Date Noted  . Diabetes mellitus without complication ([place]) 89/37/3428  . Hyperlipidemia 08/03/2019  . Hypertension 04/15/2017  . Obesity (BMI 30-39.9) 04/15/2017    Social History   Tobacco Use  . Smoking status: Former Smoker    Packs/day: 0.50    Years: 30.00    Pack years: 15.00    Types: Cigarettes    Quit date: 10/12/2019    Years since quitting: 0.5  . Smokeless tobacco: Never Used  Vaping Use  . Vaping Use: Never used  Substance Use Topics  . Alcohol use: No  . Drug use: No    Current Medications and Allergies:    Current Outpatient Medications:  .  amLODipine (NORVASC) 10 MG tablet, TAKE 1 TABLET BY MOUTH EVERY DAY, Disp: 90 tablet, Rfl: 1 .  atorvastatin (LIPITOR) 40 MG tablet, TAKE 1 TABLET BY MOUTH EVERY DAY, Disp: 90 tablet, Rfl: 1 .  ibuprofen (ADVIL,MOTRIN) 200 MG tablet, Take 400-600 mg by mouth every 6 (six) hours as needed., Disp: , Rfl:  .  lisinopril-hydrochlorothiazide (ZESTORETIC) 20-25 MG tablet, TAKE 1 TABLET BY MOUTH  EVERY DAY, Disp: 90 tablet, Rfl: 1 .  metFORMIN (GLUCOPHAGE) 500 MG tablet, Take 1 tablet (500 mg total) by mouth 2 (two) times daily with a meal., Disp: 180 tablet, Rfl: 1 .  Semaglutide,0.25 or 0.5MG /DOS, (OZEMPIC, 0.25 OR 0.5 MG/DOSE,) 2 MG/1.5ML SOPN, Inject 0.5 mg into the skin once a week., Disp: 1 pen, Rfl: 2  No Known Allergies  Review of Systems   ROS  Negative unless otherwise specified per HPI.  Vitals:   Vitals:   04/23/20 1536  BP: (!) 136/78  Pulse: 94  Temp: 98.5 F (36.9 C)  TempSrc: Temporal  SpO2: 94%  Weight: (!) 230 lb 12.8 oz (104.7 kg)  Height: 5\' 7"  (1.702 m)     Body mass index is 36.15 kg/m.   Physical Exam:    Physical Exam Vitals and nursing note reviewed.  Constitutional:      General: Robert Pratt is not in acute distress.     Appearance: Robert Pratt is well-developed. Robert Pratt is not ill-appearing or toxic-appearing.  Cardiovascular:     Rate and Rhythm: Normal rate and regular rhythm.     Pulses: Normal pulses.     Heart sounds: Normal heart sounds, S1 normal and S2 normal.     Comments: No LE edema Pulmonary:     Effort: Pulmonary effort is normal.     Breath sounds: Normal breath sounds.  Skin:    General: Skin is warm and dry.  Neurological:     Mental Status: Robert Pratt is alert.     GCS: GCS eye subscore is 4. GCS verbal subscore is 5. GCS motor subscore is 6.  Psychiatric:        Speech: Speech normal.        Behavior: Behavior normal. Behavior is cooperative.    Lab Results  Component Value Date   HGBA1C 6.5 (A) 04/23/2020      Assessment and Plan:    Robert Pratt was seen today for hypertension and diabetes.  Diagnoses and all orders for this visit:  Diabetes mellitus without complication [facility]) Robert Pratt is making great progress with his weight and diet. Will continue to work towards A1c under 6.5%. Continue Metformin 500 mg BID and I encouraged him to take Ozempic regularly to help with weight loss. Follow-up in 3 months. -     POCT HgB A1C  Essential hypertension Well controlled. Continue current regimen.  . Reviewed expectations re: course of current medical issues. . Discussed self-management of symptoms. . Outlined signs and symptoms indicating need for more acute intervention. . Patient verbalized understanding and all questions were answered. . See orders for this visit as documented in the electronic medical record. . Patient received an After Visit Summary.  CMA or LPN served as scribe during this visit. History, Physical, and Plan performed by medical provider. The above documentation has been reviewed and is accurate and complete.  Inda Coke, PA-C [place] 04/23/2020  Follow-up: No follow-ups on file.

## 2020-04-29 ENCOUNTER — Other Ambulatory Visit: Payer: Self-pay | Admitting: Physician Assistant

## 2020-05-01 ENCOUNTER — Other Ambulatory Visit: Payer: Self-pay | Admitting: Physician Assistant

## 2020-07-04 ENCOUNTER — Other Ambulatory Visit: Payer: Self-pay | Admitting: Physician Assistant

## 2020-07-25 ENCOUNTER — Encounter: Payer: Self-pay | Admitting: Physician Assistant

## 2020-07-25 ENCOUNTER — Other Ambulatory Visit: Payer: Self-pay

## 2020-07-25 ENCOUNTER — Ambulatory Visit (INDEPENDENT_AMBULATORY_CARE_PROVIDER_SITE_OTHER): Payer: BC Managed Care – PPO | Admitting: Physician Assistant

## 2020-07-25 VITALS — BP 120/74 | HR 70 | Temp 98.7°F | Ht 67.0 in | Wt 235.0 lb

## 2020-07-25 DIAGNOSIS — I10 Essential (primary) hypertension: Secondary | ICD-10-CM

## 2020-07-25 DIAGNOSIS — E119 Type 2 diabetes mellitus without complications: Secondary | ICD-10-CM

## 2020-07-25 MED ORDER — RYBELSUS 7 MG PO TABS: 7.0000 mg | ORAL_TABLET | Freq: Every day | ORAL | 2 refills | Status: DC

## 2020-07-25 NOTE — Patient Instructions (Signed)
It was great to see you!  Stop Ozempic.  Start Rybelsus 3 mg daily. Continue this for 1 month -- this will be the entire sample pack. Make sure you take it correctly (ON EMPTY STOMACH WITH SMALL AMOUNT OF WATER AND NO OTHER PILLS)  After you finish the 3 mg dosage, you can pick up the increased 7mg  dosage. I'm sending this in today but you don't need to start it until after you finish the 3 mg dosage.  Let's follow-up in 3 months, sooner if you have concerns.  Take care,  Inda Coke PA-C

## 2020-07-25 NOTE — Progress Notes (Signed)
Robert Pratt is a 49 y.o. male is here for follow up.  I acted as a Education administrator for [employer], PA-C [name], LPN   History of Present Illness:   Chief Complaint  Patient presents with  . Hypertension  . Diabetes    HPI   Hypertension Pt following up, checking blood pressure at home 3 x's a week averaging 125-155/90's. Currently taking amlodipine 10 mg daily, Lisinopril-HCTZ 20-25 mg daily. Pt denies headaches, dizziness, blurred vision, chest pain, SOB or lower leg edema. Denies excessive caffeine intake, stimulant usage, excessive alcohol intake or increase in salt consumption.  BP Readings from Last 3 Encounters:  07/25/20 120/74  04/23/20 (!) 136/78  12/17/19 120/86    Diabetes Pt currently taking Metformin 500 mg BID, Ozempic 0.5 mg once a week, has not been doing regularly. Last checked blood sugar was 125 before dinner.  He is doing well but has a difficult time remembering to take the weekly ozempic. He is still interested in something to help him lose weight. Denies dizziness/lightheadness.  Wt Readings from Last 3 Encounters:  07/25/20 235 lb (106.6 kg)  04/23/20 (!) 230 lb 12.8 oz (104.7 kg)  12/17/19 232 lb (105.2 kg)   Lab Results  Component Value Date   HGBA1C 6.5 (A) 04/23/2020     Health Maintenance Due  Topic Date Due  . Hepatitis C Screening  Never done  . FOOT EXAM  Never done  . COVID-19 Vaccine (1) Never done    Past Medical History:  Diagnosis Date  . Hypertension      Social History   Tobacco Use  . Smoking status: Former Smoker    Packs/day: 0.50    Years: 30.00    Pack years: 15.00    Types: Cigarettes    Quit date: 10/12/2019    Years since quitting: 0.7  . Smokeless tobacco: Never Used  Vaping Use  . Vaping Use: Never used  Substance Use Topics  . Alcohol use: No  . Drug use: No    History reviewed. No pertinent surgical history.  Family History  Problem Relation Age of Onset  . Diabetes Mother   .  Hypertension Mother   . Cancer Father        bone, liver  . Diabetes Father   . Hypertension Father   . Prostate cancer Neg Hx     PMHx, SurgHx, SocialHx, FamHx, Medications, and Allergies were reviewed in the Visit Navigator and updated as appropriate.   Patient Active Problem List   Diagnosis Date Noted  . Diabetes mellitus without complication ([place]) 16/38/4665  . Hyperlipidemia 08/03/2019  . Hypertension 04/15/2017  . Obesity (BMI 30-39.9) 04/15/2017    Social History   Tobacco Use  . Smoking status: Former Smoker    Packs/day: 0.50    Years: 30.00    Pack years: 15.00    Types: Cigarettes    Quit date: 10/12/2019    Years since quitting: 0.7  . Smokeless tobacco: Never Used  Vaping Use  . Vaping Use: Never used  Substance Use Topics  . Alcohol use: No  . Drug use: No    Current Medications and Allergies:    Current Outpatient Medications:  .  amLODipine (NORVASC) 10 MG tablet, TAKE 1 TABLET BY MOUTH EVERY DAY, Disp: 90 tablet, Rfl: 1 .  atorvastatin (LIPITOR) 40 MG tablet, TAKE 1 TABLET BY MOUTH EVERY DAY, Disp: 90 tablet, Rfl: 1 .  ibuprofen (ADVIL,MOTRIN) 200 MG tablet, Take 400-600 mg  by mouth every 6 (six) hours as needed., Disp: , Rfl:  .  lisinopril-hydrochlorothiazide (ZESTORETIC) 20-25 MG tablet, TAKE 1 TABLET BY MOUTH EVERY DAY, Disp: 90 tablet, Rfl: 1 .  metFORMIN (GLUCOPHAGE) 500 MG tablet, TAKE 1 TABLET BY MOUTH 2 (TWO) TIMES DAILY WITH A MEAL., Disp: 180 tablet, Rfl: 1 .  Semaglutide,0.25 or 0.5MG /DOS, (OZEMPIC, 0.25 OR 0.5 MG/DOSE,) 2 MG/1.5ML SOPN, Inject 0.5 mg into the skin once a week., Disp: 1 pen, Rfl: 2  No Known Allergies  Review of Systems   ROS  Negative unless otherwise specified per HPI.  Vitals:   Vitals:   07/25/20 1538  BP: 120/74  Pulse: 70  Temp: 98.7 F (37.1 C)  TempSrc: Temporal  SpO2: 97%  Weight: 235 lb (106.6 kg)  Height: 5\' 7"  (1.702 m)     Body mass index is 36.81 kg/m.   Physical Exam:    Physical  Exam Vitals and nursing note reviewed.  Constitutional:      General: He is not in acute distress.    Appearance: He is well-developed. He is not ill-appearing or toxic-appearing.  Cardiovascular:     Rate and Rhythm: Normal rate and regular rhythm.     Pulses: Normal pulses.     Heart sounds: Normal heart sounds, S1 normal and S2 normal.     Comments: No LE edema Pulmonary:     Effort: Pulmonary effort is normal.     Breath sounds: Normal breath sounds.  Skin:    General: Skin is warm and dry.  Neurological:     Mental Status: He is alert.     GCS: GCS eye subscore is 4. GCS verbal subscore is 5. GCS motor subscore is 6.  Psychiatric:        Speech: Speech normal.        Behavior: Behavior normal. Behavior is cooperative.      Assessment and Plan:    Robert Pratt was seen today for hypertension and diabetes.  Diagnoses and all orders for this visit:  Diabetes mellitus without complication ([place]) Suspect well controlled. We decided to defer A1c to next visit. Continue metformin 500 mg BID. Stop Ozempic due to poor compliance, start 3 mg Rybelsus (sample pack provided.) I have also sent in next dosage for him to start next month. Follow-up in 3 months, sooner if concerns.  Essential hypertension Currently well controlled. Continue Norvasc 10 mg daily and Lisinopril-HCTZ 20-25 mg daily. Follow-up in 3 months, sooner if concerns.  CMA or LPN served as scribe during this visit. History, Physical, and Plan performed by medical provider. The above documentation has been reviewed and is accurate and complete.   Inda Coke, PA-C [place], [place] 07/25/2020  Follow-up: No follow-ups on file.

## 2020-10-31 ENCOUNTER — Other Ambulatory Visit: Payer: Self-pay

## 2020-10-31 ENCOUNTER — Encounter: Payer: Self-pay | Admitting: Physician Assistant

## 2020-10-31 ENCOUNTER — Ambulatory Visit: Payer: BC Managed Care – PPO | Admitting: Physician Assistant

## 2020-10-31 ENCOUNTER — Ambulatory Visit (INDEPENDENT_AMBULATORY_CARE_PROVIDER_SITE_OTHER): Payer: BC Managed Care – PPO | Admitting: Physician Assistant

## 2020-10-31 VITALS — BP 126/74 | HR 68 | Temp 98.1°F | Ht 67.0 in | Wt 238.0 lb

## 2020-10-31 DIAGNOSIS — E785 Hyperlipidemia, unspecified: Secondary | ICD-10-CM | POA: Diagnosis not present

## 2020-10-31 DIAGNOSIS — E119 Type 2 diabetes mellitus without complications: Secondary | ICD-10-CM

## 2020-10-31 DIAGNOSIS — I10 Essential (primary) hypertension: Secondary | ICD-10-CM | POA: Diagnosis not present

## 2020-10-31 DIAGNOSIS — K2100 Gastro-esophageal reflux disease with esophagitis, without bleeding: Secondary | ICD-10-CM | POA: Diagnosis not present

## 2020-10-31 LAB — COMPREHENSIVE METABOLIC PANEL
ALT: 16 U/L (ref 0–53)
AST: 16 U/L (ref 0–37)
Albumin: 4.2 g/dL (ref 3.5–5.2)
Alkaline Phosphatase: 52 U/L (ref 39–117)
BUN: 10 mg/dL (ref 6–23)
CO2: 31 mEq/L (ref 19–32)
Calcium: 10.3 mg/dL (ref 8.4–10.5)
Chloride: 101 mEq/L (ref 96–112)
Creatinine, Ser: 1.03 mg/dL (ref 0.40–1.50)
GFR: 85.25 mL/min (ref >60.00)
Glucose, Bld: 122 mg/dL — ABNORMAL HIGH (ref 70–99)
Potassium: 4.1 mEq/L (ref 3.5–5.1)
Sodium: 135 mEq/L (ref 135–145)
Total Bilirubin: 0.5 mg/dL (ref 0.2–1.2)
Total Protein: 7.4 g/dL (ref 6.0–8.3)

## 2020-10-31 LAB — CBC WITH DIFFERENTIAL/PLATELET
Basophils Absolute: 0 10*3/uL (ref 0.0–0.1)
Basophils Relative: 0.5 % (ref 0.0–3.0)
Eosinophils Absolute: 0.1 10*3/uL (ref 0.0–0.7)
Eosinophils Relative: 2.6 % (ref 0.0–5.0)
HCT: 44.3 % (ref 39.0–52.0)
Hemoglobin: 14.7 g/dL (ref 13.0–17.0)
Lymphocytes Relative: 34.5 % (ref 12.0–46.0)
Lymphs Abs: 1.8 10*3/uL (ref 0.7–4.0)
MCHC: 33.1 g/dL (ref 30.0–36.0)
MCV: 87.9 fl (ref 78.0–100.0)
Monocytes Absolute: 0.3 10*3/uL (ref 0.1–1.0)
Monocytes Relative: 5.5 % (ref 3.0–12.0)
Neutro Abs: 3 10*3/uL (ref 1.4–7.7)
Neutrophils Relative %: 56.9 % (ref 43.0–77.0)
Platelets: 268 10*3/uL (ref 150.0–400.0)
RBC: 5.05 Mil/uL (ref 4.22–5.81)
RDW: 14 % (ref 11.5–15.5)
WBC: 5.3 10*3/uL (ref 4.0–10.5)

## 2020-10-31 LAB — LIPID PANEL
Cholesterol: 126 mg/dL (ref 0–200)
HDL: 31 mg/dL — ABNORMAL LOW (ref >39.00)
LDL Cholesterol: 66 mg/dL (ref 0–99)
NonHDL: 94.52
Total CHOL/HDL Ratio: 4
Triglycerides: 141 mg/dL (ref 0.0–149.0)
VLDL: 28.2 mg/dL (ref 0.0–40.0)

## 2020-10-31 LAB — HEMOGLOBIN A1C: Hgb A1c MFr Bld: 7.4 % — ABNORMAL HIGH (ref 4.6–6.5)

## 2020-10-31 MED ORDER — FAMOTIDINE 20 MG PO TABS
20.0000 mg | ORAL_TABLET | Freq: Two times a day (BID) | ORAL | 0 refills | Status: DC
Start: 2020-10-31 — End: 2020-11-24

## 2020-10-31 NOTE — Patient Instructions (Signed)
It was great to see you!  Update blood work today. I'll refill medications after everything has returned.  Start twice daily pepcid for your heartburn symptoms. If you do not have improvement, come back and see me. If worsening, please go to urgent care or ER over weekend.  I will advise you after you get your lab work back about the rybelsus, hold on to it for now.  Bring your paperwork when you can.  Take care,  Inda Coke PA-C

## 2020-10-31 NOTE — Progress Notes (Signed)
Robert Pratt is a 50 y.o. male here for a new problem.   History of Present Illness:   Chief Complaint  Patient presents with  . Follow-up    Acid reflux?    HPI   HTN Currently taking Norvasc 10 mg daily and Lisinopril-HCTZ 20-25 mg daily. At home blood pressure readings are: normal. Patient denies chest pain, SOB, blurred vision, dizziness, unusual headaches, lower leg swelling. Patient is compliant with medication. Denies excessive caffeine intake, stimulant usage, excessive alcohol intake, or increase in salt consumption.  BP Readings from Last 3 Encounters:  10/31/20 126/74  07/25/20 120/74  04/23/20 (!) 136/78     Upper chest globus sensation Started this morning, feels tightness in upper chest, feels acid coming up his throat. Comes and goes. Took Mylanta about two hours ago and this helped somewhat. Denies: shortness of breath, chest pain, palpitations, rectal bleeding, n/v, worsening symptoms with activity. Yesterday he ate a bowl of rice and then he said that he had some peanut butter/jelly without bread right before going to bed, doesn't know if this is contributing.   Wt Readings from Last 4 Encounters:  10/31/20 238 lb (108 kg)  07/25/20 235 lb (106.6 kg)  04/23/20 (!) 230 lb 12.8 oz (104.7 kg)  12/17/19 232 lb (105.2 kg)    Diabetes 3 month follow-up. Current DM meds: metformin 500 mg BID. Was also prescribed rybelsus 7 mg daily but he never was able to pick this up from the pharmacy unfortunately. Blood sugars at home are: not checked. Denies: hypoglycemic or hyperglycemic episodes or symptoms. This patient's diabetes is complicated by HLD and HTN.    Lab Results  Component Value Date   HGBA1C 6.5 (A) 04/23/2020    HLD Takes lipitor 40 mg daily. Tolerates well without any significant issues.    Past Medical History:  Diagnosis Date  . Hypertension      Social History   Tobacco Use  . Smoking status: Former Smoker    Packs/day: 0.50     Years: 30.00    Pack years: 15.00    Types: Cigarettes    Quit date: 10/12/2019    Years since quitting: 1.0  . Smokeless tobacco: Never Used  Vaping Use  . Vaping Use: Never used  Substance Use Topics  . Alcohol use: No  . Drug use: No    History reviewed. No pertinent surgical history.  Family History  Problem Relation Age of Onset  . Diabetes Mother   . Hypertension Mother   . Cancer Father        bone, liver  . Diabetes Father   . Hypertension Father   . Prostate cancer Neg Hx     No Known Allergies  Current Medications:   Current Outpatient Medications:  .  amLODipine (NORVASC) 10 MG tablet, TAKE 1 TABLET BY MOUTH EVERY DAY, Disp: 90 tablet, Rfl: 1 .  atorvastatin (LIPITOR) 40 MG tablet, TAKE 1 TABLET BY MOUTH EVERY DAY, Disp: 90 tablet, Rfl: 1 .  famotidine (PEPCID) 20 MG tablet, Take 1 tablet (20 mg total) by mouth 2 (two) times daily., Disp: 60 tablet, Rfl: 0 .  ibuprofen (ADVIL,MOTRIN) 200 MG tablet, Take 400-600 mg by mouth every 6 (six) hours as needed., Disp: , Rfl:  .  lisinopril-hydrochlorothiazide (ZESTORETIC) 20-25 MG tablet, TAKE 1 TABLET BY MOUTH EVERY DAY, Disp: 90 tablet, Rfl: 1 .  metFORMIN (GLUCOPHAGE) 500 MG tablet, TAKE 1 TABLET BY MOUTH 2 (TWO) TIMES DAILY WITH A MEAL., Disp: 180  tablet, Rfl: 1 .  Semaglutide (RYBELSUS) 7 MG TABS, Take 7 mg by mouth daily., Disp: 30 tablet, Rfl: 2   Review of Systems:   ROS  Negative unless otherwise specified per HPI.  Vitals:   Vitals:   10/31/20 1334  BP: 126/74  Pulse: 68  Temp: 98.1 F (36.7 C)  TempSrc: Oral  SpO2: 98%  Weight: 238 lb (108 kg)  Height: 5\' 7"  (1.702 m)     Body mass index is 37.28 kg/m.  Physical Exam:   Physical Exam Vitals and nursing note reviewed.  Constitutional:      General: He is not in acute distress.    Appearance: He is well-developed. He is not ill-appearing, toxic-appearing or sickly-appearing.  Cardiovascular:     Rate and Rhythm: Normal rate and regular  rhythm.     Pulses: Normal pulses.     Heart sounds: Normal heart sounds, S1 normal and S2 normal.     Comments: No LE edema Pulmonary:     Effort: Pulmonary effort is normal.     Breath sounds: Normal breath sounds.  Abdominal:     General: Abdomen is flat. Bowel sounds are normal.     Palpations: Abdomen is soft.     Tenderness: There is no abdominal tenderness.  Skin:    General: Skin is warm, dry and intact.  Neurological:     Mental Status: He is alert.     GCS: GCS eye subscore is 4. GCS verbal subscore is 5. GCS motor subscore is 6.  Psychiatric:        Mood and Affect: Mood and affect normal.        Speech: Speech normal.        Behavior: Behavior normal. Behavior is cooperative.     Assessment and Plan:   Robert Pratt was seen today for follow-up.  Diagnoses and all orders for this visit:  Primary hypertension Well controlled Continue Norvasc 10 mg daily and Lisinopril-HCTZ 20-25 mg daily Update bloodwork Follow-up in 9 months -     CBC with Differential/Platelet -     Comprehensive metabolic panel  Diabetes mellitus without complication ([facility]) Update A1c Maintain 500 mg metformin bid Will have him trial rybelsus if GERD symptoms resolve, to help with weight loss -     Hemoglobin A1c  Hyperlipidemia, unspecified hyperlipidemia type Update lipid panel today and adjust 40 mg lipitor prn -     Lipid panel  Gastroesophageal reflux disease with esophagitis, unspecified whether hemorrhage No red flags. No suggestions of exertional component. Trial pepcid BID. Worsening over weekend, advised to go to ER or urgent care. Any lack of improvement into next week, recommend follow-up with me.  Other orders -     famotidine (PEPCID) 20 MG tablet; Take 1 tablet (20 mg total) by mouth 2 (two) times daily.   Inda Coke, PA-C

## 2020-11-04 ENCOUNTER — Telehealth: Payer: Self-pay

## 2020-11-04 NOTE — Telephone Encounter (Signed)
Pt dropped off FMLA paperwork. Put paperwork in Samantha's folder. Pt states paperwork needs to be faxed by feb 13

## 2020-11-10 NOTE — Telephone Encounter (Signed)
Spoke to pt told him FMLA paperwork is done and I will fax to [place]. Pt verbalized understanding. Faxed

## 2020-11-22 ENCOUNTER — Other Ambulatory Visit: Payer: Self-pay | Admitting: Physician Assistant

## 2021-01-04 ENCOUNTER — Other Ambulatory Visit: Payer: Self-pay | Admitting: Physician Assistant

## 2021-01-22 ENCOUNTER — Ambulatory Visit: Payer: BC Managed Care – PPO | Attending: Internal Medicine

## 2021-01-22 ENCOUNTER — Other Ambulatory Visit: Payer: Self-pay

## 2021-01-22 DIAGNOSIS — Z20822 Contact with and (suspected) exposure to covid-19: Secondary | ICD-10-CM | POA: Diagnosis not present

## 2021-01-23 ENCOUNTER — Telehealth: Payer: Self-pay | Admitting: *Deleted

## 2021-01-23 ENCOUNTER — Telehealth: Payer: Self-pay

## 2021-01-23 LAB — NOVEL CORONAVIRUS, NAA: SARS-CoV-2, NAA: NOT DETECTED

## 2021-01-23 LAB — SARS-COV-2, NAA 2 DAY TAT

## 2021-01-23 NOTE — Telephone Encounter (Signed)
Patient clarified that he seen the diagnosis as suspected of covid, and thought that meant he was positive. Informed patient that it was the regular diagnosis we associate with the test, that the test results have not come back yet.

## 2021-01-23 NOTE — Telephone Encounter (Signed)
Nurse Assessment Nurse: Ferdinand Lango, RN, Kenney Houseman Date/Time Eilene Ghazi Time): 01/23/2021 12:20:24 AM Confirm and document reason for call. If symptomatic, describe symptoms. ---Caller states his COVID results says suspected and he wants to know that means. He does not have any symptoms and was just getting tested periodically. Does the patient have any new or worsening symptoms? ---No Please document clinical information provided and list any resource used. ---Advised caller to notify his Fredrich Birks at work that he is unsure of his test results and to wear a mask. Advised caller to contact office in the morning to get clarification of his test results. Caller verbalized understanding. Disp. Time Eilene Ghazi Time) Disposition Final User 01/23/2021 12:23:17 AM Clinical Call Yes Ferdinand Lango, RN, Nicole Kindred

## 2021-01-23 NOTE — Telephone Encounter (Signed)
Please see message and advise 

## 2021-01-23 NOTE — Telephone Encounter (Signed)
Error

## 2021-01-23 NOTE — Telephone Encounter (Signed)
Patient called back and said he is declining a visit, due to having no symptoms.

## 2021-01-23 NOTE — Telephone Encounter (Signed)
Spoke to pt told him calling about FMLA paperwork, needing to clarify how much time you are needing off for appointments? Pt said he just found out that he gave me the wrong paperwork and will bring correct paperwork by. Told him okay. Pt said he is needing 5 days a week and 8 hours a day. Told him okay I will keep an eye out for paperwork.

## 2021-01-23 NOTE — Telephone Encounter (Signed)
LVM asking to call back and get scheduled. 

## 2021-02-09 NOTE — Telephone Encounter (Signed)
Left message on voicemail that FMLA paperwork was done and faxed this morning. Any questions please call office.

## 2021-02-09 NOTE — Telephone Encounter (Signed)
Gave patient message below.

## 2021-02-10 DIAGNOSIS — R059 Cough, unspecified: Secondary | ICD-10-CM | POA: Diagnosis not present

## 2021-02-10 DIAGNOSIS — U071 COVID-19: Secondary | ICD-10-CM | POA: Diagnosis not present

## 2021-02-13 DIAGNOSIS — Z6836 Body mass index (BMI) 36.0-36.9, adult: Secondary | ICD-10-CM | POA: Diagnosis not present

## 2021-02-13 DIAGNOSIS — U071 COVID-19: Secondary | ICD-10-CM | POA: Diagnosis not present

## 2021-03-10 NOTE — Telephone Encounter (Signed)
Patient is needing to speak to Butch Penny concerning his FMLA paperwork. Advised she will call back tomorrow

## 2021-03-11 NOTE — Telephone Encounter (Signed)
Spoke to pt asked him how can I help you? Pt said he needs his FMLA fixed. It needs to say 5 x's a week and 8 hours for treatment. Told him I did put 8 hours per day I will fix and add treatment. He said he will fax papers over end of today. Told him okay I will fill them out again.

## 2021-03-16 NOTE — Telephone Encounter (Signed)
Patient returned phone call, Robert Pratt states he faxed it over last week and has a confirmation sheet that everything went through. Doesn't know how else to get this done.

## 2021-03-16 NOTE — Telephone Encounter (Signed)
Left message on voicemail to call office. I have not received his FMLA paperwork. Does he still need it filled out?

## 2021-03-18 NOTE — Telephone Encounter (Signed)
Papers were on my desk this morning. I will work on them tomorrow when back in office.

## 2021-03-19 NOTE — Telephone Encounter (Signed)
Late Entry 8:00 AM Called [facility] to speak to someone about pt's forms. Spoke to [place] told her I am trying fix pt's forms for him. He said he was told by his employer need to change amount of appointments and time. Levada Dy said she does not have access to his forms will have someone call me back.  Called [facility] again since no one called me back spoke to [place], told her trying to correct pt's forms for him about time need to be out for appointments. Delcie Roch said claim has been closed and that I have the wrong forms. She will have pt's Lead claim person call me back. Told her okay.

## 2021-03-19 NOTE — Telephone Encounter (Signed)
Pt called back told him I just got off phone with [place] at [facility] and she told me how to correct your papers. Told him I will have provider sign tomorrow and fax over. Pt verbalized understanding.

## 2021-03-19 NOTE — Telephone Encounter (Signed)
Lisabeth Pick from [employer] called back, told her I am trying to fix pt's papers for intermittent leave, something about the time out per treatment and hours. The person I spoke to earlier said I had the wrong papers. Lisabeth Pick said you have the right papers and it just needs to be corrected due to pt out more then asked for. Told her he said need to say up to 5 appointments per week and 8 hours per treatment. She said yes and can say 1 day too which ever is greater will give pt. Told her okay thank you. I will have provider sign tomorrow and fax them over to you. Trina verbalized understanding.

## 2021-03-19 NOTE — Telephone Encounter (Signed)
Left message on voicemail to call office.  

## 2021-03-19 NOTE — Telephone Encounter (Signed)
Duplicate

## 2021-03-24 NOTE — Telephone Encounter (Signed)
FMLA papers faxed on Friday 6/24.

## 2021-04-18 DIAGNOSIS — Z20822 Contact with and (suspected) exposure to covid-19: Secondary | ICD-10-CM | POA: Diagnosis not present

## 2021-04-18 DIAGNOSIS — F17210 Nicotine dependence, cigarettes, uncomplicated: Secondary | ICD-10-CM | POA: Diagnosis not present

## 2021-04-18 DIAGNOSIS — J069 Acute upper respiratory infection, unspecified: Secondary | ICD-10-CM | POA: Diagnosis not present

## 2021-04-18 DIAGNOSIS — F17200 Nicotine dependence, unspecified, uncomplicated: Secondary | ICD-10-CM | POA: Diagnosis not present

## 2021-04-27 DIAGNOSIS — R059 Cough, unspecified: Secondary | ICD-10-CM | POA: Diagnosis not present

## 2021-04-27 DIAGNOSIS — U071 COVID-19: Secondary | ICD-10-CM | POA: Diagnosis not present

## 2021-04-29 ENCOUNTER — Emergency Department (HOSPITAL_BASED_OUTPATIENT_CLINIC_OR_DEPARTMENT_OTHER)
Admission: EM | Admit: 2021-04-29 | Discharge: 2021-04-29 | Disposition: A | Discharge status: Home / Self Care | Payer: BC Managed Care – PPO | Attending: Emergency Medicine | Admitting: Emergency Medicine

## 2021-04-29 ENCOUNTER — Encounter (HOSPITAL_BASED_OUTPATIENT_CLINIC_OR_DEPARTMENT_OTHER): Payer: Self-pay | Admitting: *Deleted

## 2021-04-29 ENCOUNTER — Other Ambulatory Visit: Payer: Self-pay

## 2021-04-29 ENCOUNTER — Emergency Department (HOSPITAL_BASED_OUTPATIENT_CLINIC_OR_DEPARTMENT_OTHER): Payer: BC Managed Care – PPO

## 2021-04-29 DIAGNOSIS — J029 Acute pharyngitis, unspecified: Secondary | ICD-10-CM | POA: Diagnosis not present

## 2021-04-29 DIAGNOSIS — I10 Essential (primary) hypertension: Secondary | ICD-10-CM | POA: Insufficient documentation

## 2021-04-29 DIAGNOSIS — J3489 Other specified disorders of nose and nasal sinuses: Secondary | ICD-10-CM | POA: Diagnosis not present

## 2021-04-29 DIAGNOSIS — M791 Myalgia, unspecified site: Secondary | ICD-10-CM | POA: Diagnosis not present

## 2021-04-29 DIAGNOSIS — Z7984 Long term (current) use of oral hypoglycemic drugs: Secondary | ICD-10-CM | POA: Insufficient documentation

## 2021-04-29 DIAGNOSIS — Z20822 Contact with and (suspected) exposure to covid-19: Secondary | ICD-10-CM | POA: Diagnosis not present

## 2021-04-29 DIAGNOSIS — E119 Type 2 diabetes mellitus without complications: Secondary | ICD-10-CM | POA: Diagnosis not present

## 2021-04-29 DIAGNOSIS — Z87891 Personal history of nicotine dependence: Secondary | ICD-10-CM | POA: Insufficient documentation

## 2021-04-29 DIAGNOSIS — Z79899 Other long term (current) drug therapy: Secondary | ICD-10-CM | POA: Insufficient documentation

## 2021-04-29 DIAGNOSIS — R059 Cough, unspecified: Secondary | ICD-10-CM | POA: Insufficient documentation

## 2021-04-29 MED ORDER — BENZONATATE 100 MG PO CAPS: 100.0000 mg | ORAL_CAPSULE | Freq: Three times a day (TID) | ORAL | 0 refills | Status: DC

## 2021-04-29 MED ORDER — AZITHROMYCIN 250 MG PO TABS: 250.0000 mg | ORAL_TABLET | Freq: Every day | ORAL | 0 refills | Status: DC

## 2021-04-29 NOTE — ED Notes (Signed)
MD notified of BP, Pt denies taking BP meds for several days

## 2021-04-29 NOTE — ED Provider Notes (Signed)
Robert Pratt [facility] Provider Note   CSN: [id number] Arrival date & time: 04/29/21  1428     History Chief Complaint  Patient presents with   Robert Pratt    Robert Pratt is a 50 y.o. male with past medical history significant for Pratt, Robert Pratt, Robert Pratt, Robert Pratt, Robert Pratt, Robert Pratt.  Began on Sunday.  Initially seen on Monday morning had negative COVID, strep.  States he has some Pratt productive of some dark sputum.  No fever, neck pain, neck stiffness, voice changes, chest pain, shortness of breath abdominal pain, diarrhea, dysuria.  No unilateral leg swelling, redness or warmth.  Significant other here with similar complaint.  Denies additional aggravating or alleviating factors.  History obtained from patient and past medical records.  No interpreter used.  Significant other her with similar symptoms  HPI     Past Medical History:  Diagnosis Date   Robert Pratt     Patient Active Problem List   Diagnosis Date Noted   Robert Pratt mellitus without complication ([place]) [id number]   Robert 08/03/2019   Robert Pratt 04/15/2017   Pratt (BMI 30-39.9) 04/15/2017    History reviewed. No pertinent surgical history.     Family History  Problem Relation Age of Onset   Robert Pratt Mother    Robert Pratt Mother    Cancer Father        bone, liver   Robert Pratt Father    Robert Pratt Father    Prostate cancer Neg Hx     Social History   Tobacco Use   Smoking status: Former    Packs/day: 0.50    Years: 30.00    Pack years: 15.00    Types: Cigarettes    Quit date: 10/12/2019    Years since quitting: 1.5   Smokeless tobacco: Never  Vaping Use   Vaping Use: Never used  Substance Use Topics   Alcohol use: No   Drug use: No    Home Medications Prior to Admission medications   Medication Sig Start Date End Date Taking? Authorizing Provider  azithromycin (ZITHROMAX) 250 MG tablet Take 1  tablet (250 mg total) by mouth daily. Take first 2 tablets together, then 1 every day until finished. 04/29/21  Yes Ken Bonn A, PA-C  benzonatate (TESSALON) 100 MG capsule Take 1 capsule (100 mg total) by mouth every 8 (eight) hours. 04/29/21  Yes Lean Fayson A, PA-C  amLODipine (NORVASC) 10 MG tablet TAKE 1 TABLET BY MOUTH EVERY DAY 01/05/21   Inda Coke, PA  atorvastatin (LIPITOR) 40 MG tablet TAKE 1 TABLET BY MOUTH EVERY DAY 01/05/21   Inda Coke, PA  famotidine (PEPCID) 20 MG tablet TAKE 1 TABLET BY MOUTH TWICE A DAY 11/24/20   Inda Coke, PA  ibuprofen (ADVIL,MOTRIN) 200 MG tablet Take 400-600 mg by mouth every 6 (six) hours as needed.    [provider]  lisinopril-hydrochlorothiazide (ZESTORETIC) 20-25 MG tablet TAKE 1 TABLET BY MOUTH EVERY DAY 01/05/21   Inda Coke, PA  metFORMIN (GLUCOPHAGE) 500 MG tablet TAKE 1 TABLET BY MOUTH 2 (TWO) TIMES DAILY WITH A MEAL. 04/29/20   Inda Coke, PA  Semaglutide (RYBELSUS) 7 MG TABS Take 7 mg by mouth daily. 07/25/20   Inda Coke, PA    Allergies    Patient has no known allergies.  Review of Systems   Review of Systems  Constitutional:  Positive for fatigue.  HENT:  Positive for congestion, rhinorrhea and Robert Pratt.   Respiratory:  Positive for Pratt.  Cardiovascular: Negative.   Gastrointestinal: Negative.   Genitourinary: Negative.   Musculoskeletal:  Positive for Robert Pratt.  Skin: Negative.   Neurological: Negative.   All other systems reviewed and are negative.  Physical Exam Updated Vital Signs BP (!) 162/118 (BP Location: Right Arm)   Pulse 69   Temp 98.4 F (36.9 C) (Oral)   Resp 18   Ht '5\' 7"'$  (1.702 m)   Wt 105.2 kg   SpO2 100%   BMI 36.34 kg/m   Physical Exam Vitals and nursing note reviewed.  Constitutional:      General: He is not in acute distress.    Appearance: He is well-developed. He is obese. He is not ill-appearing, toxic-appearing or diaphoretic.  HENT:      Head: Normocephalic and atraumatic.     Nose: Rhinorrhea present. No congestion.     Mouth/Pratt:     Mouth: Mucous membranes are moist. No oral lesions.     Pharynx: Oropharynx is clear. No pharyngeal swelling, oropharyngeal exudate, posterior oropharyngeal erythema or uvula swelling.     Tonsils: No tonsillar exudate or tonsillar abscesses. 0 on the right. 0 on the left.     Comments: Posterior oropharynx clear.  Uvula midline.  No evidence of PTA or RPA.  No evidence of tonsillar edema or exudate.  No intraoral edema, lesions.  Eyes:     Pupils: Pupils are equal, round, and reactive to light.  Neck:     Comments: No neck stiffness or neck rigidity Cardiovascular:     Rate and Rhythm: Normal rate and regular rhythm.     Heart sounds: Normal heart sounds.  Pulmonary:     Effort: Pulmonary effort is normal. No respiratory distress.     Breath sounds: Normal air entry. No stridor, decreased air movement or transmitted upper airway sounds.     Comments: Course lung sounds, clears with Pratt.  Speaks in full sentences without difficulty Abdominal:     General: Bowel sounds are normal. There is no distension.     Palpations: Abdomen is soft.     Comments: Soft, nontender without rebound or guarding  Musculoskeletal:        General: Normal range of motion.     Cervical back: Normal range of motion and neck supple.  Skin:    General: Skin is warm and dry.     Capillary Refill: Capillary refill takes less than 2 seconds.  Neurological:     General: No focal deficit present.     Mental Status: He is alert and oriented to person, place, and time.   ED Results / Procedures / Treatments   Labs (all labs ordered are listed, but only abnormal results are displayed) Labs Reviewed  SARS CORONAVIRUS 2 (TAT 6-24 HRS)    EKG None  Radiology DG Chest Portable 1 View  Result Date: 04/29/2021 CLINICAL DATA:  Pratt EXAM: PORTABLE CHEST 1 VIEW COMPARISON:  12/02/2012 FINDINGS: The heart size  and mediastinal contours are within normal limits. Both lungs are clear. The visualized skeletal structures are unremarkable. IMPRESSION: No active disease. Electronically Signed   By: Donavan Foil M.D.   On: 04/29/2021 18:55    Procedures Procedures   Medications Ordered in ED Medications - No data to display  ED Course  I have reviewed the triage vital signs and the nursing notes.  Pertinent labs & imaging results that were available during my care of the patient were reviewed by me and considered in my medical decision making (see chart  for details).  Here for evaluation of UR complaints x 3 days. Afebrile, non septic, non ill appearing.  Heart clear.  Course lung sounds, clears with Pratt. Abdomen soft, nontender.  He is without tachycardia, tachypnea or hypoxia.  No clinical evidence of DVT on exam. No exertional CP, DOE to suggest atypical ACS. Scratchy Pratt x 3 days. No evidence of PTA, RPA. Negative PCR strep outpatient. Low suspicion for pharyngitis. Will get chest xray, repeat covid testing. Appears otherwise well.  DG chest without acute abnormality  COVID pending at discharge. Discussed sx management. Will treat for bronchitis  The patient has been appropriately medically screened and/or stabilized in the ED. I have low suspicion for any other emergent medical condition which would require further screening, evaluation or treatment in the ED or require inpatient management.  Patient is hemodynamically stable and in no acute distress.  Patient able to ambulate in department prior to ED.  Evaluation does not show acute pathology that would require ongoing or additional emergent interventions while in the emergency department or further inpatient treatment.  I have discussed the diagnosis with the patient and answered all questions.  Pain is been managed while in the emergency department and patient has no further complaints prior to discharge.  Patient is comfortable with plan  discussed in room and is stable for discharge at this time.  I have discussed strict return precautions for returning to the emergency department.  Patient was encouraged to follow-up with PCP/specialist refer to at discharge.      MDM Rules/Calculators/A&P                           KEISHAUN LUDDEN was evaluated in Emergency Department on 04/29/2021 for the symptoms described in the history of present illness. He was evaluated in the context of the global COVID-19 pandemic, which necessitated consideration that the patient might be at risk for infection with the SARS-CoV-2 virus that causes COVID-19. Institutional protocols and algorithms that pertain to the evaluation of patients at risk for COVID-19 are in a state of rapid change based on information released by regulatory bodies including the CDC and federal and state organizations. These policies and algorithms were followed during the patient's care in the ED.  Final Clinical Impression(s) / ED Diagnoses Final diagnoses:  Pratt    Rx / DC Orders ED Discharge Orders          Ordered    benzonatate (TESSALON) 100 MG capsule  Every 8 hours        04/29/21 2034    azithromycin (ZITHROMAX) 250 MG tablet  Daily        04/29/21 2034             Misk Galentine A, PA-C 04/29/21 2036    Lucrezia Starch, MD 05/02/21 2159

## 2021-04-29 NOTE — ED Triage Notes (Signed)
C/o cough , sore throat , body aches x 4 days

## 2021-04-29 NOTE — Discharge Instructions (Addendum)
You will be called with your COVID test results if they are positive otherwise take medications as prescribed.  Return for new or worsening symptoms  Your blood pressure medicine when you get home as your blood pressure was elevated here.  Follow-up with primary care provider for reevaluation of this

## 2021-04-30 LAB — SARS CORONAVIRUS 2 (TAT 6-24 HRS): SARS Coronavirus 2: NEGATIVE

## 2021-05-14 ENCOUNTER — Telehealth: Payer: Self-pay | Admitting: *Deleted

## 2021-05-14 NOTE — Telephone Encounter (Signed)
Spoke to pt told him received FMLA paperwork you dropped off. Asked him if something has changed cause I just sent it in on 03/20/2021. Pt said I know but was told needed to be done again. Told pt okay will fill out and send in again. Pt verbalized understanding.

## 2021-05-14 NOTE — Telephone Encounter (Signed)
Called pt told him I called [employer] regarding FMLA paperwork and was told you need to start a new claim cause you have exhausted all your FMLA on the old claim. In order to get extension you must file a new claim and then I will fill out new paperwork when you bring it by. Pt verbalized understanding.

## 2021-05-14 NOTE — Telephone Encounter (Signed)
Called [place] and spoke to [place], gave her claim # for pt's FMLA paperwork and told her pt brought new forms to be filled out again with documentation due 02/09/2021. Told her I faxed completed forms on 03/20/2021. Kayla said yes, but pt has exhausted all FMLA with this claim and needs to start a new claim if he needs extension. Told her okay I willlet pt know. Kayla verbalized understanding.

## 2021-05-21 ENCOUNTER — Telehealth: Payer: Self-pay

## 2021-05-21 NOTE — Telephone Encounter (Signed)
.  Type of forms received:FMLA   Routed [id number] [employer]  Paperwork received by : Jinny Blossom    Individual made aware of 3-5 business day turn around (Y/N):YES    Form completed and patient made aware of charges(Y/N):   Faxed to : CALL PATIENT   Form location:  Placed in samantha box

## 2021-05-27 NOTE — Telephone Encounter (Signed)
FMLA paperwork faxed to [facility].

## 2021-05-27 NOTE — Telephone Encounter (Signed)
Spoke to pt told him FMLA paperwork is complete do you want to pick up or do you want me to fax to [facility]? Pt said you can fax if you would please and they told me to say 8 appointments. Told him I did up to 8 appt's per month and 8 hours per day for both sections. Pt verbalized understanding. Told him I will fax paperwork over today for you. Pt verbalized understanding.

## 2021-06-11 ENCOUNTER — Emergency Department (HOSPITAL_COMMUNITY): Payer: BC Managed Care – PPO

## 2021-06-11 ENCOUNTER — Emergency Department (HOSPITAL_COMMUNITY)
Admission: EM | Admit: 2021-06-11 | Discharge: 2021-06-12 | Discharge status: Against Medical Advice | Payer: BC Managed Care – PPO | Attending: Emergency Medicine | Admitting: Emergency Medicine

## 2021-06-11 ENCOUNTER — Encounter (HOSPITAL_COMMUNITY): Payer: Self-pay

## 2021-06-11 DIAGNOSIS — R0602 Shortness of breath: Secondary | ICD-10-CM | POA: Insufficient documentation

## 2021-06-11 DIAGNOSIS — Z5321 Procedure and treatment not carried out due to patient leaving prior to being seen by health care provider: Secondary | ICD-10-CM | POA: Insufficient documentation

## 2021-06-11 DIAGNOSIS — R0609 Other forms of dyspnea: Secondary | ICD-10-CM | POA: Insufficient documentation

## 2021-06-11 DIAGNOSIS — Z20822 Contact with and (suspected) exposure to covid-19: Secondary | ICD-10-CM | POA: Insufficient documentation

## 2021-06-11 HISTORY — DX: Type 2 diabetes mellitus without complications: E11.9

## 2021-06-11 LAB — RESP PANEL BY RT-PCR (FLU A&B, COVID) ARPGX2
Influenza A by PCR: NEGATIVE
Influenza B by PCR: NEGATIVE
SARS Coronavirus 2 by RT PCR: NEGATIVE

## 2021-06-11 LAB — CBC
HCT: 47.3 % (ref 39.0–52.0)
Hemoglobin: 15 g/dL (ref 13.0–17.0)
MCH: 29 pg (ref 26.0–34.0)
MCHC: 31.7 g/dL (ref 30.0–36.0)
MCV: 91.3 fL (ref 80.0–100.0)
Platelets: 266 10*3/uL (ref 150–400)
RBC: 5.18 MIL/uL (ref 4.22–5.81)
RDW: 13.2 % (ref 11.5–15.5)
WBC: 7 10*3/uL (ref 4.0–10.5)
nRBC: 0 % (ref 0.0–0.2)

## 2021-06-11 LAB — BASIC METABOLIC PANEL
Anion gap: 9 (ref 5–15)
BUN: 13 mg/dL (ref 6–20)
CO2: 22 mmol/L (ref 22–32)
Calcium: 8.9 mg/dL (ref 8.9–10.3)
Chloride: 106 mmol/L (ref 98–111)
Creatinine, Ser: 0.91 mg/dL (ref 0.61–1.24)
GFR, Estimated: >60 mL/min (ref >60)
Glucose, Bld: 140 mg/dL — ABNORMAL HIGH (ref 70–99)
Potassium: 3.8 mmol/L (ref 3.5–5.1)
Sodium: 137 mmol/L (ref 135–145)

## 2021-06-11 LAB — TROPONIN I (HIGH SENSITIVITY): Troponin I (High Sensitivity): 19 ng/L — ABNORMAL HIGH (ref <18)

## 2021-06-11 NOTE — ED Triage Notes (Signed)
Pt from home with SOB that's been ongoing for the past 2 days. Patient stated that he has not been able to taste anything, but able to smell. Patient stating that he's been having loose stools that has been ongoing for the past two days. Pt having difficulty taking in deep breaths and dyspnea with exertion. Patient not endorsing any fevers, but chills and profuse sweating.

## 2021-06-11 NOTE — ED Provider Notes (Signed)
Emergency Medicine Provider Triage Evaluation Note  Robert Pratt , a 50 y.o. male  was evaluated in triage.  Pt complains of sob, cough loss of taste and smell, diarrhea.  Review of Systems  Positive: sob, cough loss of taste and smell, diarrhea. Negative: Chest pain  Physical Exam  BP (!) 169/98 (BP Location: Right Arm)   Pulse 71   Temp 98.7 F (37.1 C) (Oral)   Resp 19   SpO2 100%  Gen:   Awake, no distress   Resp:  Normal effort  MSK:   Moves extremities without difficulty  Other:  Lungs clear  Medical Decision Making  Medically screening exam initiated at 8:51 PM.  Appropriate orders placed.  Robert Pratt was informed that the remainder of the evaluation will be completed by another provider, this initial triage assessment does not replace that evaluation, and the importance of remaining in the ED until their evaluation is complete.     Robert Pratt 06/11/21 2155    Daleen Bo, MD 06/13/21 1136

## 2021-06-12 LAB — TROPONIN I (HIGH SENSITIVITY): Troponin I (High Sensitivity): 19 ng/L — ABNORMAL HIGH (ref <18)

## 2021-06-12 NOTE — ED Notes (Signed)
Pt stated he was leaving ED. Pt encouraged to stay. Pt declined.

## 2021-06-23 ENCOUNTER — Other Ambulatory Visit: Payer: Self-pay

## 2021-06-23 ENCOUNTER — Emergency Department (HOSPITAL_BASED_OUTPATIENT_CLINIC_OR_DEPARTMENT_OTHER)
Admission: EM | Admit: 2021-06-23 | Discharge: 2021-06-23 | Disposition: A | Discharge status: Home / Self Care | Payer: BC Managed Care – PPO | Attending: Emergency Medicine | Admitting: Emergency Medicine

## 2021-06-23 ENCOUNTER — Emergency Department (HOSPITAL_BASED_OUTPATIENT_CLINIC_OR_DEPARTMENT_OTHER): Payer: BC Managed Care – PPO

## 2021-06-23 ENCOUNTER — Encounter (HOSPITAL_BASED_OUTPATIENT_CLINIC_OR_DEPARTMENT_OTHER): Payer: Self-pay

## 2021-06-23 DIAGNOSIS — M791 Myalgia, unspecified site: Secondary | ICD-10-CM | POA: Diagnosis not present

## 2021-06-23 DIAGNOSIS — Z20822 Contact with and (suspected) exposure to covid-19: Secondary | ICD-10-CM | POA: Diagnosis not present

## 2021-06-23 DIAGNOSIS — R059 Cough, unspecified: Secondary | ICD-10-CM | POA: Diagnosis not present

## 2021-06-23 DIAGNOSIS — F17210 Nicotine dependence, cigarettes, uncomplicated: Secondary | ICD-10-CM | POA: Insufficient documentation

## 2021-06-23 DIAGNOSIS — I10 Essential (primary) hypertension: Secondary | ICD-10-CM | POA: Diagnosis not present

## 2021-06-23 DIAGNOSIS — Z79899 Other long term (current) drug therapy: Secondary | ICD-10-CM | POA: Diagnosis not present

## 2021-06-23 DIAGNOSIS — E119 Type 2 diabetes mellitus without complications: Secondary | ICD-10-CM | POA: Insufficient documentation

## 2021-06-23 DIAGNOSIS — Z7984 Long term (current) use of oral hypoglycemic drugs: Secondary | ICD-10-CM | POA: Diagnosis not present

## 2021-06-23 LAB — RESP PANEL BY RT-PCR (FLU A&B, COVID) ARPGX2
Influenza A by PCR: NEGATIVE
Influenza B by PCR: NEGATIVE
SARS Coronavirus 2 by RT PCR: NEGATIVE

## 2021-06-23 MED ORDER — ALBUTEROL SULFATE HFA 108 (90 BASE) MCG/ACT IN AERS
2.0000 | INHALATION_SPRAY | Freq: Once | RESPIRATORY_TRACT | Status: AC
  Administered 2021-06-23: 2 via RESPIRATORY_TRACT
  Filled 2021-06-23: qty 6.7

## 2021-06-23 MED ORDER — BENZONATATE 100 MG PO CAPS
200.0000 mg | ORAL_CAPSULE | Freq: Once | ORAL | Status: AC
  Administered 2021-06-23: 200 mg via ORAL
  Filled 2021-06-23: qty 2

## 2021-06-23 MED ORDER — BENZONATATE 200 MG PO CAPS: 200.0000 mg | ORAL_CAPSULE | Freq: Three times a day (TID) | ORAL | 0 refills | Status: DC

## 2021-06-23 MED ORDER — PREDNISONE 20 MG PO TABS: 40.0000 mg | ORAL_TABLET | Freq: Every day | ORAL | 0 refills | Status: AC

## 2021-06-23 NOTE — ED Triage Notes (Addendum)
Pt c/o cough x 3 weeks-body aches x today-NAD-steady gait

## 2021-06-23 NOTE — ED Provider Notes (Signed)
[facility] Provider Note   CSN: [id number] Arrival date & time: 06/23/21  1132     History Chief Complaint  Patient presents with   Cough    Robert Pratt is a 50 y.o. male.  Robert Pratt is a 50 y.o. male with a history of hypertension, diabetes and obesity, who presents to the ED for evaluation of persistent cough.  He reports he has been having cough that is intermittently productive of sputum for the past 3 weeks.  He reports symptoms initially started with a dry cough, after cough persisted he also developed some generalized body aches.  Has not had any known fevers.  Denies associated congestion or rhinorrhea.  Denies sore throat.  He reports that his wife developed similar symptoms about a week ago.  He denies any associated chest pain aside from soreness with coughing.  No shortness of breath.  No pleuritic pain.  No associated abdominal pain, nausea, vomiting or diarrhea.  Unsure of sick contacts.  Patient has been vaccinated for COVID and reports that a COVID infection earlier this year.  He reports he has been taking over-the-counter Robitussin and Mucinex without improvement.  Patient reports he does smoke and has continued to smoke during the symptoms.  The history is provided by the patient and medical records.      Past Medical History:  Diagnosis Date   Diabetes mellitus without complication ([place])    Hypertension     Patient Active Problem List   Diagnosis Date Noted   Diabetes mellitus without complication ([place]) 59/16/3846   Hyperlipidemia 08/03/2019   Hypertension 04/15/2017   Obesity (BMI 30-39.9) 04/15/2017    History reviewed. No pertinent surgical history.     Family History  Problem Relation Age of Onset   Diabetes Mother    Hypertension Mother    Cancer Father        bone, liver   Diabetes Father    Hypertension Father    Prostate cancer Neg Hx     Social History   Tobacco Use   Smoking status: Every  Day    Packs/day: 0.50    Years: 30.00    Pack years: 15.00    Types: Cigarettes    Last attempt to quit: 10/12/2019    Years since quitting: 1.6   Smokeless tobacco: Never  Vaping Use   Vaping Use: Never used  Substance Use Topics   Alcohol use: No   Drug use: No    Home Medications Prior to Admission medications   Medication Sig Start Date End Date Taking? Authorizing Provider  benzonatate (TESSALON) 200 MG capsule Take 1 capsule (200 mg total) by mouth every 8 (eight) hours. 06/23/21  Yes Jacqlyn Larsen, PA-C  predniSONE (DELTASONE) 20 MG tablet Take 2 tablets (40 mg total) by mouth daily for 5 days. 06/23/21 06/28/21 Yes Jacqlyn Larsen, PA-C  amLODipine (NORVASC) 10 MG tablet TAKE 1 TABLET BY MOUTH EVERY DAY 01/05/21   Inda Coke, PA  atorvastatin (LIPITOR) 40 MG tablet TAKE 1 TABLET BY MOUTH EVERY DAY 01/05/21   Inda Coke, PA  azithromycin (ZITHROMAX) 250 MG tablet Take 1 tablet (250 mg total) by mouth daily. Take first 2 tablets together, then 1 every day until finished. 04/29/21   Henderly, Britni A, PA-C  famotidine (PEPCID) 20 MG tablet TAKE 1 TABLET BY MOUTH TWICE A DAY 11/24/20   Inda Coke, PA  ibuprofen (ADVIL,MOTRIN) 200 MG tablet Take 400-600 mg by mouth every 6 (  six) hours as needed.    [provider]  lisinopril-hydrochlorothiazide (ZESTORETIC) 20-25 MG tablet TAKE 1 TABLET BY MOUTH EVERY DAY 01/05/21   Inda Coke, PA  metFORMIN (GLUCOPHAGE) 500 MG tablet TAKE 1 TABLET BY MOUTH 2 (TWO) TIMES DAILY WITH A MEAL. 04/29/20   Inda Coke, PA  Semaglutide (RYBELSUS) 7 MG TABS Take 7 mg by mouth daily. 07/25/20   Inda Coke, PA    Allergies    Patient has no known allergies.  Review of Systems   Review of Systems  Constitutional:  Negative for chills and fever.  HENT:  Negative for congestion, rhinorrhea and sore throat.   Respiratory:  Positive for cough. Negative for shortness of breath.   Cardiovascular:  Negative for chest pain and  leg swelling.  Gastrointestinal:  Negative for abdominal pain, diarrhea, nausea and vomiting.  Musculoskeletal:  Positive for myalgias. Negative for arthralgias.  Skin:  Negative for color change and rash.  Neurological:  Negative for headaches.  All other systems reviewed and are negative.  Physical Exam Updated Vital Signs BP (!) 161/84 (BP Location: Left Arm)   Pulse 81   Temp 98.9 F (37.2 C) (Oral)   Resp 18   Ht 5\' 7"  (1.702 m)   Wt 108.4 kg   SpO2 99%   BMI 37.43 kg/m   Physical Exam Vitals and nursing note reviewed.  Constitutional:      General: He is not in acute distress.    Appearance: Normal appearance. He is well-developed. He is not ill-appearing or diaphoretic.  HENT:     Head: Normocephalic and atraumatic.     Nose: No congestion or rhinorrhea.     Mouth/Throat:     Mouth: Mucous membranes are moist.     Pharynx: Oropharynx is clear.  Eyes:     General:        Right eye: No discharge.        Left eye: No discharge.  Cardiovascular:     Rate and Rhythm: Normal rate and regular rhythm.     Pulses: Normal pulses.     Heart sounds: Normal heart sounds.  Pulmonary:     Effort: Pulmonary effort is normal. No respiratory distress.     Breath sounds: Normal breath sounds. No wheezing or rales.     Comments: Respirations equal and unlabored, patient able to speak in full sentences, lungs clear to auscultation bilaterally, some decreased air movement in bilateral bases Abdominal:     General: Bowel sounds are normal. There is no distension.     Palpations: Abdomen is soft. There is no mass.     Tenderness: There is no abdominal tenderness. There is no guarding.     Comments: Abdomen soft, nondistended, nontender to palpation in all quadrants without guarding or peritoneal signs  Musculoskeletal:        General: No deformity.     Cervical back: Neck supple.  Skin:    General: Skin is warm and dry.     Capillary Refill: Capillary refill takes less than 2  seconds.  Neurological:     Mental Status: He is alert and oriented to person, place, and time.     Coordination: Coordination normal.     Comments: Speech is clear, able to follow commands CN III-XII intact Normal strength in upper and lower extremities bilaterally including dorsiflexion and plantar flexion, strong and equal grip strength Sensation normal to light and sharp touch Moves extremities without ataxia, coordination intact  Psychiatric:  Mood and Affect: Mood normal.        Behavior: Behavior normal.    ED Results / Procedures / Treatments   Labs (all labs ordered are listed, but only abnormal results are displayed) Labs Reviewed  RESP PANEL BY RT-PCR (FLU A&B, COVID) [id number]    EKG None  Radiology DG Chest Port 1 View  Result Date: 06/23/2021 CLINICAL DATA:  Cough and body aches for 3 weeks. EXAM: PORTABLE CHEST 1 VIEW COMPARISON:  06/11/2021 FINDINGS: The cardiac silhouette, mediastinal and hilar contours are normal. The lungs are clear. No pleural effusions. No pulmonary lesions. IMPRESSION: No acute cardiopulmonary findings. Electronically Signed   By: Marijo Sanes M.D.   On: 06/23/2021 14:38    Procedures Procedures   Medications Ordered in ED Medications  albuterol (VENTOLIN HFA) 108 (90 Base) MCG/ACT inhaler 2 puff (2 puffs Inhalation Given 06/23/21 1334)  benzonatate (TESSALON) capsule 200 mg (200 mg Oral Given 06/23/21 1330)    ED Course  I have reviewed the triage vital signs and the nursing notes.  Pertinent labs & imaging results that were available during my care of the patient were reviewed by me and considered in my medical decision making (see chart for details).    MDM Rules/Calculators/A&P                           Patient with 3 weeks of persistent cough, on exam he is well-appearing and in no distress, lungs clear he does have some decreased breath sounds in bilateral bases, has an underlying history of smoking.  Satting at 100% on  room air.  Patient with no associated chest pain or shortness of breath.  His chest x-ray is clear without evidence of pneumonia or other active cardiopulmonary disease and COVID and flu testing is negative as well.  Suspect patient may have postinfectious reactive bronchitis leading to persistent cough.  Will treat with albuterol, Tessalon Perles and short course of low-dose prednisone.  Patient does have underlying diabetes that is well controlled with metformin, discussed benefits of using this medication to help reduce inflammation and patient would like to try it.  He will check his blood sugars and I also discussed close dietary modification and follow-up.  Discussed outpatient PCP follow-up and return precautions.  Patient expresses understanding and agreement.  Discharged home in good condition  Final Clinical Impression(s) / ED Diagnoses Final diagnoses:  Cough    Rx / DC Orders ED Discharge Orders          Ordered    predniSONE (DELTASONE) 20 MG tablet  Daily        06/23/21 1607    benzonatate (TESSALON) 200 MG capsule  Every 8 hours        06/23/21 1607             Jacqlyn Larsen, [place] 06/23/21 2014    Lucrezia Starch, MD 06/25/21 1044

## 2021-06-23 NOTE — Discharge Instructions (Addendum)
Cough after upper respiratory infection can sometimes linger for weeks.  With prednisone over the next 5 days.  Make sure you are keeping a close eye on your blood sugar while taking this medication.  And avoid eating sugary foods and carbohydrates blood sugars are spiking.  You can also use albuterol inhaler every 4 hours as needed and take Tessalon Perles 3 times daily to help with cough.  Using cough drops or water to help keep your throat moist and will likely help as well.

## 2021-07-06 ENCOUNTER — Telehealth: Payer: Self-pay

## 2021-07-06 NOTE — Telephone Encounter (Signed)
Patient is calling in stating that his daughter had an appointment on 06/05/21, and asked if they would be able to provide a work note for that date as his work is requiring that he have it.

## 2021-07-06 NOTE — Telephone Encounter (Signed)
LVM to call back.

## 2021-07-06 NOTE — Telephone Encounter (Signed)
Noted  

## 2021-07-06 NOTE — Telephone Encounter (Signed)
Lauren, I need to know who his daughter is? Also is the work note for him or daughter? Did he bring her to appt? If for daughter message needs to go in her chart not his.

## 2021-07-06 NOTE — Telephone Encounter (Signed)
Pt called back. Robert Pratt stated that he had to take his daughter to another doctor on  06/05/21. His daughter is not our pt and has never been seen here. I told pt that he needs to call that office and ask because it daughter is not our pt. Robert Pratt then asked if we can write a note stating that he was seen at our office. I told pt that we cannot do that either because he was not seen on that day. I informed pt that we are not able to write that note.

## 2021-07-12 ENCOUNTER — Other Ambulatory Visit: Payer: Self-pay | Admitting: Physician Assistant

## 2021-07-27 ENCOUNTER — Other Ambulatory Visit: Payer: Self-pay | Admitting: *Deleted

## 2021-07-27 NOTE — Telephone Encounter (Signed)
Error

## 2021-07-29 ENCOUNTER — Telehealth (INDEPENDENT_AMBULATORY_CARE_PROVIDER_SITE_OTHER): Payer: BC Managed Care – PPO | Admitting: Physician Assistant

## 2021-07-29 ENCOUNTER — Telehealth: Payer: Self-pay

## 2021-07-29 ENCOUNTER — Encounter: Payer: Self-pay | Admitting: Physician Assistant

## 2021-07-29 ENCOUNTER — Other Ambulatory Visit: Payer: Self-pay | Admitting: *Deleted

## 2021-07-29 VITALS — BP 136/90 | HR 73 | Temp 98.1°F | Ht 67.0 in | Wt 231.5 lb

## 2021-07-29 DIAGNOSIS — I10 Essential (primary) hypertension: Secondary | ICD-10-CM

## 2021-07-29 DIAGNOSIS — E119 Type 2 diabetes mellitus without complications: Secondary | ICD-10-CM | POA: Diagnosis not present

## 2021-07-29 DIAGNOSIS — R059 Cough, unspecified: Secondary | ICD-10-CM

## 2021-07-29 DIAGNOSIS — K2100 Gastro-esophageal reflux disease with esophagitis, without bleeding: Secondary | ICD-10-CM

## 2021-07-29 LAB — POC INFLUENZA A&B (BINAX/QUICKVUE)
Influenza A, POC: NEGATIVE
Influenza B, POC: NEGATIVE

## 2021-07-29 LAB — POC COVID19 BINAXNOW: SARS Coronavirus 2 Ag: NEGATIVE

## 2021-07-29 MED ORDER — METFORMIN HCL 500 MG PO TABS: 500.0000 mg | ORAL_TABLET | Freq: Two times a day (BID) | ORAL | 1 refills | Status: DC

## 2021-07-29 MED ORDER — AMLODIPINE BESYLATE 10 MG PO TABS: 10.0000 mg | ORAL_TABLET | Freq: Every day | ORAL | 1 refills | Status: AC

## 2021-07-29 MED ORDER — ATORVASTATIN CALCIUM 40 MG PO TABS: 40.0000 mg | ORAL_TABLET | Freq: Every day | ORAL | 1 refills | Status: DC

## 2021-07-29 MED ORDER — DOXYCYCLINE HYCLATE 100 MG PO TABS: 100.0000 mg | ORAL_TABLET | Freq: Two times a day (BID) | ORAL | 0 refills | Status: DC

## 2021-07-29 MED ORDER — FAMOTIDINE 20 MG PO TABS: 20.0000 mg | ORAL_TABLET | Freq: Two times a day (BID) | ORAL | 2 refills | Status: DC

## 2021-07-29 MED ORDER — TIRZEPATIDE 2.5 MG/0.5ML ~~LOC~~ SOAJ: 2.5000 mg | SUBCUTANEOUS | 0 refills | Status: DC

## 2021-07-29 NOTE — Telephone Encounter (Signed)
Called requesting a call back.    States has a question he would like answered before 4:30pm if possible.    Would not disclose the question to me.

## 2021-07-29 NOTE — Telephone Encounter (Signed)
Robert Pratt pt would like to know if you would prescribe Golo for weight loss?

## 2021-07-29 NOTE — Progress Notes (Signed)
Virtual Visit via Video   I connected with Robert Pratt on 07/29/21 at  2:00 PM EDT by a video enabled telemedicine application and verified that I am speaking with the correct person using two identifiers. Location patient: Home Location provider: [facility], Office Persons participating in the virtual visit: Robert Pratt, [name] PA-C, Robert Pickler, LPN   I discussed the limitations of evaluation and management by telemedicine and the availability of in person appointments. The patient expressed understanding and agreed to proceed.  I acted as a Education administrator for [employer], [employer], LPN  Subjective:   HPI:   Cough Pt c/o cough and fatigue since yesterday. Coughing and expectorating yellow sputum. He is also having headache and pain around both eyes today since taking blood pressure pill this morning. Some chills, unable to take temperature. Pt checked blood pressure now it is 176/109, pulse is 90. Pt has not done a COVID test, no tests at home.  HTN Currently taking amlodopine 5 mg (rx is for 10 mg however -- he states that he has 5 mg dosage at home) and lisinopril-hctz 20-25 mg. At home blood pressure readings are: 150-160/70-90. Patient denies chest pain, SOB, blurred vision, dizziness, ower leg swelling. Patient is compliant with medication. Denies excessive caffeine intake, stimulant usage, excessive alcohol intake, or increase in salt consumption.  BP Readings from Last 3 Encounters:  07/29/21 136/90  06/23/21 (!) 148/68  06/12/21 (!) 172/101   Diabetes 9 month follow-up. Current DM meds: metformin 500 mg BID and rybelsus 7 mg daily. Blood sugars at home are: not checked. Patient is not compliant with medications. He is not taking rybelsus because it is not helping his appetite per his report. Denies: hypoglycemic or hyperglycemic episodes or symptoms. This patient's diabetes is complicated by HLD and HTN.  Lab Results  Component Value Date    HGBA1C 7.4 (H) 10/31/2020   GERD He is due for refill on pepcid 20 mg BID. Does well while taking this and can tell worsening symptoms when he goes without it.  ROS: See pertinent positives and negatives per HPI.  Patient Active Problem List   Diagnosis Date Noted   Diabetes mellitus without complication ([place]) 84/16/6063   Hyperlipidemia 08/03/2019   Hypertension 04/15/2017   Obesity (BMI 30-39.9) 04/15/2017    Social History   Tobacco Use   Smoking status: Every Day    Packs/day: 0.50    Years: 30.00    Pack years: 15.00    Types: Cigarettes    Last attempt to quit: 10/12/2019    Years since quitting: 1.7   Smokeless tobacco: Never  Substance Use Topics   Alcohol use: No    Current Outpatient Medications:    doxycycline (VIBRA-TABS) 100 MG tablet, Take 1 tablet (100 mg total) by mouth 2 (two) times daily., Disp: 20 tablet, Rfl: 0   ibuprofen (ADVIL,MOTRIN) 200 MG tablet, Take 400-600 mg by mouth every 6 (six) hours as needed., Disp: , Rfl:    lisinopril-hydrochlorothiazide (ZESTORETIC) 20-25 MG tablet, TAKE 1 TABLET BY MOUTH EVERY DAY, Disp: 90 tablet, Rfl: 0   tirzepatide (MOUNJARO) 2.5 MG/0.5ML Pen, Inject 2.5 mg into the skin once a week., Disp: 2 mL, Rfl: 0   amLODipine (NORVASC) 10 MG tablet, Take 1 tablet (10 mg total) by mouth daily., Disp: 90 tablet, Rfl: 1   atorvastatin (LIPITOR) 40 MG tablet, Take 1 tablet (40 mg total) by mouth daily., Disp: 90 tablet, Rfl: 1   famotidine (PEPCID)  20 MG tablet, Take 1 tablet (20 mg total) by mouth 2 (two) times daily., Disp: 60 tablet, Rfl: 2   metFORMIN (GLUCOPHAGE) 500 MG tablet, Take 1 tablet (500 mg total) by mouth 2 (two) times daily with a meal., Disp: 180 tablet, Rfl: 1   Semaglutide (RYBELSUS) 7 MG TABS, Take 7 mg by mouth daily. (Patient not taking: Reported on 07/29/2021), Disp: 30 tablet, Rfl: 2  No Known Allergies  Objective:   VITALS: Per patient if applicable, see vitals. GENERAL: Alert, appears well and in no  acute distress. HEENT: Atraumatic, conjunctiva clear, no obvious abnormalities on inspection of external nose and ears. NECK: Normal movements of the head and neck. CARDIOPULMONARY: No increased WOB. Speaking in clear sentences. I:E ratio WNL.  MS: Moves all visible extremities without noticeable abnormality. PSYCH: Pleasant and cooperative, well-groomed. Speech normal rate and rhythm. Affect is appropriate. Insight and judgement are appropriate. Attention is focused, linear, and appropriate.  NEURO: CN grossly intact. Oriented as arrived to appointment on time with no prompting. Moves both UE equally.  SKIN: No obvious lesions, wounds, erythema, or cyanosis noted on face or hands.  Results for orders placed or performed in visit on 07/29/21  POC COVID-19  Result Value Ref Range   SARS Coronavirus 2 Ag Negative Negative  POC Influenza A&B(BINAX/QUICKVUE)  Result Value Ref Range   Influenza A, POC Negative Negative   Influenza B, POC Negative Negative   Patient did come to parking lot for the above swabs Normal heart and lung exam  Today's Vitals   07/29/21 1357 07/29/21 1651  BP: (!) 176/109 136/90  Pulse: 90 73  Temp:  98.1 F (36.7 C)  TempSrc:  Temporal  SpO2:  95%  Weight: 231 lb 8 oz (105 kg)   Height: 5\' 7"  (1.702 m)    Body mass index is 36.26 kg/m.   Assessment and Plan:   Cough, unspecified type No red flags on discussion, patient is not in any obvious distress during our visit. Discussed progression of most viral illness, and recommended supportive care at this point in time. I did however provide pocket rx for oral doxycycline should symptoms not improve as anticipated. Discussed over the counter supportive care options, with recommendations to push fluids and rest. Reviewed return precautions including new/worsening fever, SOB, new/worsening cough or other concerns.  Recommended need to self-quarantine and practice social distancing until symptoms  resolve. Discussed current recommendations for COVID testing. I recommend that patient follow-up if symptoms worsen or persist despite treatment x 7-10 days, sooner if needed.  Primary hypertension Above goal at home but normotensive in parking lot Continue lisinopril-hctz 20-25 mg daily ?Compliance of norvasc 10 mg daily Recommend follow-up with [place] in office and bring medications to review them together next week at his convenience  Diabetes mellitus without complication ([place]) Recommend follow-up in office next week to update blood work Did provide patient with [facility] sample today while in parking lot for vitals  Gastroesophageal reflux disease with esophagitis, unspecified whether hemorrhage Well controlled when on medication, refill pepcid 40 mg BID   I discussed the assessment and treatment plan with the patient. The patient was provided an opportunity to ask questions and all were answered. The patient agreed with the plan and demonstrated an understanding of the instructions.   The patient was advised to call back or seek an in-person evaluation if the symptoms worsen or if the condition fails to improve as anticipated.   CMA or LPN served as Education administrator during  this visit. History, Physical, and Plan performed by medical provider. The above documentation has been reviewed and is accurate and complete.   [place], [place] 07/29/2021

## 2021-08-11 ENCOUNTER — Encounter: Payer: Self-pay | Admitting: Physician Assistant

## 2021-08-11 ENCOUNTER — Other Ambulatory Visit: Payer: Self-pay

## 2021-08-11 ENCOUNTER — Ambulatory Visit (INDEPENDENT_AMBULATORY_CARE_PROVIDER_SITE_OTHER): Payer: BC Managed Care – PPO | Admitting: Physician Assistant

## 2021-08-11 VITALS — BP 126/90 | HR 80 | Temp 98.8°F | Ht 67.0 in | Wt 238.5 lb

## 2021-08-11 DIAGNOSIS — Z1211 Encounter for screening for malignant neoplasm of colon: Secondary | ICD-10-CM

## 2021-08-11 DIAGNOSIS — E669 Obesity, unspecified: Secondary | ICD-10-CM

## 2021-08-11 DIAGNOSIS — Z720 Tobacco use: Secondary | ICD-10-CM

## 2021-08-11 DIAGNOSIS — I10 Essential (primary) hypertension: Secondary | ICD-10-CM

## 2021-08-11 DIAGNOSIS — E785 Hyperlipidemia, unspecified: Secondary | ICD-10-CM

## 2021-08-11 DIAGNOSIS — Z0000 Encounter for general adult medical examination without abnormal findings: Secondary | ICD-10-CM

## 2021-08-11 DIAGNOSIS — E119 Type 2 diabetes mellitus without complications: Secondary | ICD-10-CM

## 2021-08-11 NOTE — Progress Notes (Signed)
Subjective:    Robert Pratt is a 50 y.o. male and is here for a comprehensive physical exam.  HPI  Health Maintenance Due  Topic Date Due   COVID-19 Vaccine (1) Never done   Hepatitis C Screening  Never done   COLONOSCOPY (Pts 45-90yrs Insurance coverage will need to be confirmed)  Never done   Pneumococcal Vaccine 38-39 Years old (2 - PCV) 08/02/2020   OPHTHALMOLOGY EXAM  10/16/2020   Zoster Vaccines- Shingrix (1 of 2) Never done   HEMOGLOBIN A1C  04/30/2021    Acute Concerns: None discussed at this time.   Chronic Issues: Diabetes Garhett is compliant with metformin 500 mg BID with no adverse effects.  Denies hypoglycemic or hyperglycemic episodes or symptoms. States he has a device that helps him when it comes to logging his blood sugars. Unfortunately he didn't bring that to today's visit, but has stated his highest has been 167. Patient's DM is complicated by HTN and HLD.   Dysen has previously trialed rybelsus 7 mg and ozempic in the past but hadn't found those medication to be effective. Following our last visit he was interested in trialing mounjaro 2.5 mg injection. After demonstrating how to use this medication, he is set to begin the medication.  Diabetic eye exam was last completed 10/17/19. Justyce has declined to have this performed today.  Diabetic foot exam has not been completed. Belal has declined to have this performed today.  Lab Results  Component Value Date   HGBA1C 7.4 (H) 10/31/2020   HTN Currently compliant with taking norvasc 10 mg and zestoretic 20-25 mg daily with no adverse effects. He is currently not checking his BP at home due to the need of another machine. Patient denies chest pain, SOB, blurred vision, dizziness, unusual headaches, lower leg swelling. Denies excessive caffeine intake, stimulant usage, excessive alcohol intake, or increase in salt consumption.  BP Readings from Last 3 Encounters:  08/11/21 126/90  07/29/21 136/90   06/23/21 (!) 148/68   HLD Currently compliant with taking lipitor 40 mg with no adverse effects. He is managing well.    Health Maintenance: Immunizations -- COVID- Not completed Influenza- Last completed 08/03/19 Tdap- Last completed 03/13/12 Colonoscopy --  PSA --  Lab Results  Component Value Date   PSA 0.9 04/15/2017   Diet -- Actively trying to reduce sugar intake Dentistry- Not UTD Ophthalmology- Not UTD Sleep habits -- Normal 8-9 hours Exercise -- States he does some jumping jacks or light exercises Weight -- Stable Weight history Wt Readings from Last 10 Encounters:  08/11/21 238 lb 8 oz (108.2 kg)  07/29/21 231 lb 8 oz (105 kg)  06/23/21 239 lb (108.4 kg)  04/29/21 232 lb (105.2 kg)  10/31/20 238 lb (108 kg)  07/25/20 235 lb (106.6 kg)  04/23/20 (!) 230 lb 12.8 oz (104.7 kg)  12/17/19 232 lb (105.2 kg)  10/26/19 238 lb (108 kg)  08/03/19 236 lb (107 kg)   Body mass index is 37.35 kg/m. Mood -- Stable Tobacco use -- Currently smoking everyday  Tobacco Use: High Risk   Smoking Tobacco Use: Every Day   Smokeless Tobacco Use: Never   Passive Exposure: Not on file    Alcohol use ---  reports no history of alcohol use.   Depression screen PHQ 2/9 08/11/2021  Decreased Interest 0  Down, Depressed, Hopeless 0  PHQ - 2 Score 0     Other providers/specialists: Patient Care Team: Inda Coke, [place] as PCP -  General (Physician Assistant)   PMHx, SurgHx, SocialHx, Medications, and Allergies were reviewed in the Visit Navigator and updated as appropriate.   Past Medical History:  Diagnosis Date   Diabetes mellitus without complication ([place])    Hypertension     History reviewed. No pertinent surgical history.   Family History  Problem Relation Age of Onset   Diabetes Mother    Hypertension Mother    Cancer Father        bone, liver   Diabetes Father    Hypertension Father    Prostate cancer Neg Hx     Social History   Tobacco Use    Smoking status: Every Day    Packs/day: 0.50    Years: 30.00    Pack years: 15.00    Types: Cigarettes    Last attempt to quit: 10/12/2019    Years since quitting: 1.8   Smokeless tobacco: Never  Vaping Use   Vaping Use: Never used  Substance Use Topics   Alcohol use: No   Drug use: No    Review of Systems:   Review of Systems  Constitutional:  Negative for chills, fever, malaise/fatigue and weight loss.  HENT:  Negative for hearing loss, sinus pain and sore throat.   Respiratory:  Negative for cough and hemoptysis.   Cardiovascular:  Negative for chest pain, palpitations, leg swelling and PND.  Gastrointestinal:  Negative for abdominal pain, constipation, diarrhea, heartburn, nausea and vomiting.  Genitourinary:  Negative for dysuria, frequency and urgency.  Musculoskeletal:  Negative for back pain, myalgias and neck pain.  Skin:  Negative for itching and rash.  Neurological:  Negative for dizziness, tingling, seizures and headaches.  Endo/Heme/Allergies:  Negative for polydipsia.  Psychiatric/Behavioral:  Negative for depression. The patient is not nervous/anxious.    Objective:   Vitals:   08/11/21 1458  BP: 126/90  Pulse: 80  Temp: 98.8 F (37.1 C)  SpO2: 97%   Body mass index is 37.35 kg/m.  General Appearance:  Alert, cooperative, no distress, appears stated age  Head:  Normocephalic, without obvious abnormality, atraumatic  Eyes:  PERRL, conjunctiva/corneas clear, EOM's intact, fundi benign, both eyes       Ears:  Normal TM's and external ear canals, both ears  Nose: Nares normal, septum midline, mucosa normal, no drainage    or sinus tenderness  Throat: Lips, mucosa, and tongue normal; teeth and gums normal  Neck: Supple, symmetrical, trachea midline, no adenopathy; thyroid:  No enlargement/tenderness/nodules; no carotit bruit or JVD  Back:   Symmetric, no curvature, ROM normal, no CVA tenderness  Lungs:   Clear to auscultation bilaterally, respirations  unlabored  Chest wall:  No tenderness or deformity  Heart:  Regular rate and rhythm, S1 and S2 normal, no murmur, rub   or gallop  Abdomen:   Soft, non-tender, bowel sounds active all four quadrants, no masses, no organomegaly  Extremities: Extremities normal, atraumatic, no cyanosis or edema  Prostate: Not done.   Skin: Skin color, texture, turgor normal, no rashes or lesions  Lymph nodes: Cervical, supraclavicular, and axillary nodes normal  Neurologic: CNII-XII grossly intact. Normal strength, sensation and reflexes throughout    Assessment/Plan:   Routine physical examination Today patient counseled on age appropriate routine health concerns for screening and prevention, each reviewed and up to date or declined. Immunizations reviewed and up to date or declined. Labs ordered and reviewed. Risk factors for depression reviewed and negative. Hearing function and visual acuity are intact. ADLs screened and addressed as  needed. Functional ability and level of safety reviewed and appropriate. Education, counseling and referrals performed based on assessed risks today. Patient provided with a copy of personalized plan for preventive services.  Diabetes mellitus without complication ([place]) Update HgA1c and advise further Will adjust metformin as indicated based on blood work results Start 2.5 mg mounjaro weekly Follow-up in 3 months, sooner if concerns  Hyperlipidemia, unspecified hyperlipidemia type Update lipid panel and adjust lipitor 40 mg daily as indicated  Primary hypertension Normotensive Continue amlodopine 10 mg daily and lisinopril-hctz 20-25 mg daily Follow-up in 6 months, sooner if concerns  Tobacco abuse Encouraged cessation; he is not ready to quit at this time  Special screening for malignant neoplasms, colon counseled  Obesity (BMI 30-39.9) Recommend ongoing efforts at weight loss attemps Trial mounjaro (see above)   Patient Counseling: [x]   Nutrition: Stressed  importance of moderation in sodium/caffeine intake, saturated fat and cholesterol, caloric balance, sufficient intake of fresh fruits, vegetables, and fiber.  [x]   Stressed the importance of regular exercise.   []   Substance Abuse: Discussed cessation/primary prevention of tobacco, alcohol, or other drug use; driving or other dangerous activities under the influence; availability of treatment for abuse.   [x]   Injury prevention: Discussed safety belts, safety helmets, smoke detector, smoking near bedding or upholstery.   []   Sexuality: Discussed sexually transmitted diseases, partner selection, use of condoms, avoidance of unintended pregnancy  and contraceptive alternatives.   [x]   Dental health: Discussed importance of regular tooth brushing, flossing, and dental visits.  [x]   Health maintenance and immunizations reviewed. Please refer to Health maintenance section.    I,Havlyn C Ratchford,acting as a Education administrator for [employer], PA.,have documented all relevant documentation on the behalf of Inda Coke, PA,as directed by  Inda Coke, PA while in the presence of Inda Coke, [place].  I, Inda Coke, [place], have reviewed all documentation for this visit. The documentation on 08/11/21 for the exam, diagnosis, procedures, and orders are all accurate and complete.   Inda Coke, PA-C [place]

## 2021-08-11 NOTE — Patient Instructions (Addendum)
It was great to see you!  Start 2.5 mg mounjaro weekly x 4 weeks Hold metformin tomorrow, I will be in touch if I want you to continue this  I will send in 5 mg mounjaro weekly for you to start after one month, if you'd like to stay on the 2.5 mg dosage instead, please let me know. Please use a coupon provided for this. If you have trouble affording this, let me know.  Please call the office that did your eye exam and see if they do "diabetic eye exams". If they do, please call and schedule this. If not, please call us and we can advise further.  I have also put in colonoscopy order for you -- someone will call you to schedule this.  Please go to the lab for blood work.   Our office will call you with your results unless you have chosen to receive results via MyChart.  If your blood work is normal we will follow-up each year for physicals and as scheduled for chronic medical problems.  If anything is abnormal we will treat accordingly and get you in for a follow-up.  Take care,  Aldona Bar

## 2021-08-12 LAB — CBC WITH DIFFERENTIAL/PLATELET
Basophils Absolute: 0 10*3/uL (ref 0.0–0.1)
Basophils Relative: 0.4 % (ref 0.0–3.0)
Eosinophils Absolute: 0.2 10*3/uL (ref 0.0–0.7)
Eosinophils Relative: 2.6 % (ref 0.0–5.0)
HCT: 48.3 % (ref 39.0–52.0)
Hemoglobin: 15.6 g/dL (ref 13.0–17.0)
Lymphocytes Relative: 34.7 % (ref 12.0–46.0)
Lymphs Abs: 2.2 10*3/uL (ref 0.7–4.0)
MCHC: 32.3 g/dL (ref 30.0–36.0)
MCV: 87.8 fl (ref 78.0–100.0)
Monocytes Absolute: 0.3 10*3/uL (ref 0.1–1.0)
Monocytes Relative: 5.1 % (ref 3.0–12.0)
Neutro Abs: 3.7 10*3/uL (ref 1.4–7.7)
Neutrophils Relative %: 57.2 % (ref 43.0–77.0)
Platelets: 293 10*3/uL (ref 150.0–400.0)
RBC: 5.5 Mil/uL (ref 4.22–5.81)
RDW: 13.1 % (ref 11.5–15.5)
WBC: 6.5 10*3/uL (ref 4.0–10.5)

## 2021-08-12 LAB — LIPID PANEL
Cholesterol: 127 mg/dL (ref 0–200)
HDL: 34.3 mg/dL — ABNORMAL LOW (ref >39.00)
LDL Cholesterol: 72 mg/dL (ref 0–99)
NonHDL: 93.02
Total CHOL/HDL Ratio: 4
Triglycerides: 106 mg/dL (ref 0.0–149.0)
VLDL: 21.2 mg/dL (ref 0.0–40.0)

## 2021-08-12 LAB — COMPREHENSIVE METABOLIC PANEL
ALT: 27 U/L (ref 0–53)
AST: 23 U/L (ref 0–37)
Albumin: 4.6 g/dL (ref 3.5–5.2)
Alkaline Phosphatase: 66 U/L (ref 39–117)
BUN: 11 mg/dL (ref 6–23)
CO2: 31 mEq/L (ref 19–32)
Calcium: 9.9 mg/dL (ref 8.4–10.5)
Chloride: 97 mEq/L (ref 96–112)
Creatinine, Ser: 0.98 mg/dL (ref 0.40–1.50)
GFR: 90 mL/min (ref >60.00)
Glucose, Bld: 144 mg/dL — ABNORMAL HIGH (ref 70–99)
Potassium: 3.9 mEq/L (ref 3.5–5.1)
Sodium: 138 mEq/L (ref 135–145)
Total Bilirubin: 0.4 mg/dL (ref 0.2–1.2)
Total Protein: 8 g/dL (ref 6.0–8.3)

## 2021-08-12 LAB — HEMOGLOBIN A1C: Hgb A1c MFr Bld: 8.1 % — ABNORMAL HIGH (ref 4.6–6.5)

## 2021-09-23 ENCOUNTER — Telehealth: Payer: Self-pay | Admitting: Physician Assistant

## 2021-09-23 MED ORDER — TIRZEPATIDE 5 MG/0.5ML ~~LOC~~ SOAJ: 5.0000 mg | SUBCUTANEOUS | 0 refills | Status: DC

## 2021-09-23 NOTE — Telephone Encounter (Signed)
Left message on voicemail to call office. Rx for Mounjaro 5 mg sent to pharmacy.

## 2021-09-24 NOTE — Telephone Encounter (Signed)
Patient cb and verified DOB, advised Rx for [facility] was sent to pharmacy. Pt verbalized understanding

## 2021-10-07 NOTE — Telephone Encounter (Signed)
Pt called back and said he doesn't currently have insurance at the moment and wont until either later this month or next month, He said the price of the medication is 1,200 dollars for a 29 day supply and he wanted to know if he can get a couple of samples like last time until his insurance goes through. Call back [phone]

## 2021-10-07 NOTE — Telephone Encounter (Signed)
Spoke to pt told him I do not have any samples at present of Mounjaro and the only one I get is the Mounjaro 2.5 mg and you would need the next dose of 5 mg. Pt verbalized understanding and said he can not afford it at present due to no insurance. Told pt I understand you will just have to wait till insurance kicks in. Pt verbalized understanding.

## 2021-10-24 ENCOUNTER — Other Ambulatory Visit: Payer: Self-pay | Admitting: Physician Assistant

## 2021-11-03 ENCOUNTER — Other Ambulatory Visit: Payer: Self-pay | Admitting: Physician Assistant

## 2021-12-21 ENCOUNTER — Encounter: Payer: Self-pay | Admitting: Gastroenterology

## 2021-12-23 ENCOUNTER — Emergency Department (HOSPITAL_COMMUNITY)
Admission: EM | Admit: 2021-12-23 | Discharge: 2021-12-23 | Disposition: A | Discharge status: Home / Self Care | Payer: Managed Care, Other (non HMO) | Attending: Emergency Medicine | Admitting: Emergency Medicine

## 2021-12-23 DIAGNOSIS — I10 Essential (primary) hypertension: Secondary | ICD-10-CM | POA: Diagnosis not present

## 2021-12-23 DIAGNOSIS — E119 Type 2 diabetes mellitus without complications: Secondary | ICD-10-CM | POA: Diagnosis not present

## 2021-12-23 DIAGNOSIS — R202 Paresthesia of skin: Secondary | ICD-10-CM | POA: Diagnosis present

## 2021-12-23 DIAGNOSIS — Z7984 Long term (current) use of oral hypoglycemic drugs: Secondary | ICD-10-CM | POA: Insufficient documentation

## 2021-12-23 DIAGNOSIS — M5412 Radiculopathy, cervical region: Secondary | ICD-10-CM | POA: Diagnosis not present

## 2021-12-23 DIAGNOSIS — Z79899 Other long term (current) drug therapy: Secondary | ICD-10-CM | POA: Diagnosis not present

## 2021-12-23 MED ORDER — METHOCARBAMOL 500 MG PO TABS: 500.0000 mg | ORAL_TABLET | Freq: Two times a day (BID) | ORAL | 0 refills | Status: DC

## 2021-12-23 MED ORDER — LIDOCAINE 5 % EX PTCH
1.0000 | MEDICATED_PATCH | CUTANEOUS | Status: DC
  Administered 2021-12-23: 1 via TRANSDERMAL
  Filled 2021-12-23: qty 1

## 2021-12-23 MED ORDER — PREDNISONE 50 MG PO TABS: 50.0000 mg | ORAL_TABLET | Freq: Every day | ORAL | 0 refills | Status: AC

## 2021-12-23 NOTE — Discharge Instructions (Addendum)
You were seen in the emergency department today for your tingling in your right hand and your shortness orders..  Your physical exam and vital signs are very reassuring. ? ?The muscles in your shoulder are in what is called spasm, meaning they are inappropriately tightened up.  This can sometimes cause the tingling sensation experiencing in your arm.  This can be quite painful.  To help with your pain you may take Tylenol and / or NSAID medication (such as ibuprofen or naproxen) to help with your pain.  Additionally, you have been prescribed a muscle relaxer called Robaxin to help relieve some of the muscle spasm.  Please be advised that this medication may make you very sleepy, so you should not drive or operate heavy machinery while you are taking it. ? ?You may also utilize topical pain relief such as Biofreeze, IcyHot, or topical lidocaine patches.  I also recommend that you apply heat to the area, such as a hot shower or heating pad, and follow heat application with massage of the muscles that are most tight. ? ?Please return to the emergency department if you develop any numbness/tingling/weakness in your arms or legs, any difficulty urinating, or urinary incontinence chest pain, shortness of breath, abdominal pain, nausea or vomiting that does not stop, or any other new severe symptoms. ? ?

## 2021-12-23 NOTE — ED Triage Notes (Signed)
Pt c/o bilateral hand pain, worse on R side. Pain starts back near ulnar side of elbow, progresses down towards pinky/all fingers. CNS intact bilaterally. Unsure of injury to area, works on loading dock. ?

## 2021-12-23 NOTE — ED Provider Notes (Signed)
?[place] ?Provider Note ? ? ?CSN: [id number] ?Arrival date & time: 12/23/21  1506 ? ?  ? ?History ? ?Chief Complaint  ?Patient presents with  ? Hand Pain  ? ? ?Robert Pratt is a 51 y.o. male who presents with concern for intermittent tingling in the hands bilaterally significantly worse on the right for the last couple of weeks.  Patient recently moved to a new job where he is lifting heavy objects regularly.  States he occasionally feels an electric type pain shooting down his right arm with some tingling and numbness sensation in the hand that is intermittent.  No difficulty holding objects.  No loss of sensation or weakness in the side. ? ?No history of injury to the neck, no fevers, chills, history of autoimmune disease, or IV drug use.  I personally reviewed the patient's medical records previous history of type 2 diabetes and hypertension as well as hyperlipidemia.  Endorses compliance with medications. ? ?HPI ? ?  ? ?Home Medications ?Prior to Admission medications   ?Medication Sig Start Date End Date Taking? Authorizing Provider  ?methocarbamol (ROBAXIN) 500 MG tablet Take 1 tablet (500 mg total) by mouth 2 (two) times daily. 12/23/21  Yes Julio Storr R, PA-C  ?predniSONE (DELTASONE) 50 MG tablet Take 1 tablet (50 mg total) by mouth daily for 5 days. 12/23/21 12/28/21 Yes Maranda Marte R, PA-C  ?amLODipine (NORVASC) 10 MG tablet Take 1 tablet (10 mg total) by mouth daily. 07/29/21   Inda Coke, PA  ?atorvastatin (LIPITOR) 40 MG tablet Take 1 tablet (40 mg total) by mouth daily. 07/29/21   Inda Coke, PA  ?doxycycline (VIBRA-TABS) 100 MG tablet Take 1 tablet (100 mg total) by mouth 2 (two) times daily. 07/29/21   Inda Coke, PA  ?famotidine (PEPCID) 20 MG tablet TAKE 1 TABLET BY MOUTH TWICE A DAY 11/03/21   Inda Coke, PA  ?ibuprofen (ADVIL,MOTRIN) 200 MG tablet Take 400-600 mg by mouth every 6 (six) hours as needed.    [provider]  ?lisinopril-hydrochlorothiazide (ZESTORETIC) 20-25 MG tablet TAKE 1 TABLET BY MOUTH EVERY DAY 10/26/21   Inda Coke, PA  ?metFORMIN (GLUCOPHAGE) 500 MG tablet Take 1 tablet (500 mg total) by mouth 2 (two) times daily with a meal. 07/29/21   Inda Coke, PA  ?tirzepatide [facility]) 5 MG/0.5ML Pen Inject 5 mg into the skin once a week. 09/23/21   Inda Coke, PA  ?   ? ?Allergies    ?Patient has no known allergies.   ? ?Review of Systems   ?Review of Systems  ?Constitutional: Negative.   ?HENT: Negative.    ?Eyes: Negative.   ?Respiratory: Negative.    ?Cardiovascular: Negative.   ?Gastrointestinal: Negative.   ?Genitourinary: Negative.   ?Musculoskeletal:  Positive for back pain and myalgias.  ?Neurological:  Positive for numbness. Negative for dizziness, tremors, syncope, facial asymmetry, speech difficulty, weakness, light-headedness and headaches.  ? ?Physical Exam ?Updated Vital Signs ?BP (!) 157/109   Pulse (!) 58   Temp 98.6 ?F (37 ?C) (Oral)   Resp 15   SpO2 100%  ?Physical Exam ?Vitals and nursing note reviewed.  ?Constitutional:   ?   Appearance: He is obese. He is not ill-appearing or toxic-appearing.  ?HENT:  ?   Head: Normocephalic and atraumatic.  ?   Nose: Nose normal.  ?   Mouth/Throat:  ?   Mouth: Mucous membranes are moist.  ?   Pharynx: Oropharynx is clear. Uvula midline. No oropharyngeal  exudate or posterior oropharyngeal erythema.  ?   Tonsils: No tonsillar exudate.  ?Eyes:  ?   General: Lids are normal. Vision grossly intact.     ?   Right eye: No discharge.     ?   Left eye: No discharge.  ?   Extraocular Movements: Extraocular movements intact.  ?   Conjunctiva/sclera: Conjunctivae normal.  ?   Pupils: Pupils are equal, round, and reactive to light.  ?Neck:  ?   Trachea: Trachea and phonation normal.  ?Cardiovascular:  ?   Rate and Rhythm: Normal rate and regular rhythm.  ?   Pulses: Normal pulses.  ?   Heart sounds: Normal heart sounds. No murmur  heard. ?Pulmonary:  ?   Effort: Pulmonary effort is normal. No tachypnea, bradypnea, accessory muscle usage, prolonged expiration or respiratory distress.  ?   Breath sounds: Normal breath sounds. No wheezing or rales.  ?Chest:  ?   Chest wall: No mass, lacerations, deformity, swelling, tenderness or crepitus.  ?Abdominal:  ?   General: Bowel sounds are normal. There is no distension.  ?   Palpations: Abdomen is soft.  ?   Tenderness: There is no abdominal tenderness. There is no right CVA tenderness, left CVA tenderness, guarding or rebound.  ?Musculoskeletal:     ?   General: No deformity.  ?   Cervical back: Normal range of motion and neck supple. Spasms present. No edema, deformity, erythema, signs of trauma, rigidity, torticollis, tenderness, bony tenderness or crepitus. No pain with movement, spinous process tenderness or muscular tenderness. Normal range of motion.  ?   Thoracic back: Normal.  ?   Lumbar back: Normal.  ?   Right lower leg: No edema.  ?   Left lower leg: No edema.  ?   Comments: Spasm and tenderness palpation to the cervical paraspinous musculature and trapezius bilaterally, right greater than left, without crepitus.  Trigger point over the right trapezius.  Symmetric grip strength and sensation in the hands bilaterally.  Symmetric strength in elbow flexion and extension bilaterally.  Normal capillary refill in all digits of the hand, and symmetric radial pulses. ? ? ? ? ?  ?Lymphadenopathy:  ?   Cervical: No cervical adenopathy.  ?Skin: ?   General: Skin is warm and dry.  ?   Capillary Refill: Capillary refill takes less than 2 seconds.  ?Neurological:  ?   General: No focal deficit present.  ?   Mental Status: He is alert and oriented to person, place, and time. Mental status is at baseline.  ?Psychiatric:     ?   Mood and Affect: Mood normal.  ? ? ?ED Results / Procedures / Treatments   ?Labs ?(all labs ordered are listed, but only abnormal results are displayed) ?Labs Reviewed - No data to  display ? ?EKG ?None ? ?Radiology ?No results found. ? ?Procedures ?Procedures  ? ? ?Medications Ordered in ED ?Medications - No data to display ? ? ?ED Course/ Medical Decision Making/ A&P ?  ?                        ?Medical Decision Making ?51 year old male with shoulder soreness and intermittent tingling down the right hand. ? ?Hypertensive on intake, vitals otherwise normal.  Cardiopulmonary sounds normal, abdominal, benign.  Musculoskeletal exam as above with spasm of the trapezius bilaterally, right greater than left with trigger point in the right trapezius.  Neurologic exam is without focal deficit neurovascular status  is normal. ? ?Lidocaine patch administered.  Clinical picture most consistent with cervical radiculopathy.  Will discharge with steroid burst and muscle relaxer.  Recommend topical analgesia as well. ? ?Risk ?Prescription drug management. ? ? ?No clinical concern for more emergent underlying neurologic etiology for the patient's symptoms.  HPI most consistent with muscular spasm and cervical radiculopathy as is physical exam above. ? ?Patient will follow-up in the outpatient setting with his PCP.  No further work-up warranted near this time. ? ?Holman voiced understanding of her medical evaluation and treatment plan. Each of their questions answered to their expressed satisfaction.  Return precautions were given.  Patient is well-appearing, stable, and was discharged in good condition. ? ?This chart was dictated using voice recognition software, Dragon. Despite the best efforts of this provider to proofread and correct errors, errors may still occur which can change documentation meaning. ? ? ? ?Final Clinical Impression(s) / ED Diagnoses ?Final diagnoses:  ?Cervical radiculopathy  ? ? ?Rx / DC Orders ?ED Discharge Orders   ? ?      Ordered  ?  predniSONE (DELTASONE) 50 MG tablet  Daily       ? 12/23/21 1831  ?  methocarbamol (ROBAXIN) 500 MG tablet  2 times daily       ? 12/23/21 1831  ? ?   ?  ? ?  ? ? ?  ?Emeline Darling, PA-C ?12/24/21 0103 ? ?  ?Jeanell Sparrow, DO ?12/24/21 0121 ? ?

## 2021-12-23 NOTE — ED Provider Triage Note (Signed)
Emergency Medicine Provider Triage Evaluation Note ? ?Robert Pratt , a 51 y.o. male  was evaluated in triage.  Pt complains of pain in his hands.  He has right sided pain in his hands and forearm. States that the pain is worse on the ulnar side. It comes and goes. Feels like sharp tingling pains. Shoots up to elbow. Worse when he extends his wrist. The pains on his left hand are localized to the finger tips of the 3rd, 4th, and 5th digits. Similar symptoms except that the pain does not radiate. He lifts a lot for his job. ? ?Review of Systems  ?Positive:  ?Negative:  ? ?Physical Exam  ?BP (!) 183/101 (BP Location: Left Arm)   Pulse 76   Temp 98.6 ?F (37 ?C) (Oral)   Resp 16   SpO2 99%  ?Gen:   Awake, no distress   ?Resp:  Normal effort  ?MSK:   Moves extremities without difficulty  ?Other:  Radial pulses 2 + bilaterally ? ?Medical Decision Making  ?Medically screening exam initiated at 4:01 PM.  Appropriate orders placed.  Robert Pratt was informed that the remainder of the evaluation will be completed by another provider, this initial triage assessment does not replace that evaluation, and the importance of remaining in the ED until their evaluation is complete. ? ? ?  ?Adolphus Birchwood, PA-C ?12/23/21 1603 ? ?

## 2021-12-23 NOTE — ED Notes (Signed)
Pt A&Ox4 ambulatory at d/c with independent steady gait, NAD. Pt verbalized understanding of d/c instructions, prescriptions and follow up care. ?

## 2021-12-28 ENCOUNTER — Encounter (HOSPITAL_COMMUNITY): Payer: Self-pay

## 2021-12-28 ENCOUNTER — Emergency Department (HOSPITAL_COMMUNITY)
Admission: EM | Admit: 2021-12-28 | Discharge: 2021-12-28 | Disposition: A | Discharge status: Home / Self Care | Payer: Managed Care, Other (non HMO) | Attending: Student | Admitting: Student

## 2021-12-28 ENCOUNTER — Other Ambulatory Visit: Payer: Self-pay

## 2021-12-28 DIAGNOSIS — Z0279 Encounter for issue of other medical certificate: Secondary | ICD-10-CM | POA: Diagnosis present

## 2021-12-28 DIAGNOSIS — Z0289 Encounter for other administrative examinations: Secondary | ICD-10-CM

## 2021-12-28 NOTE — ED Provider Notes (Signed)
?[place] ?Provider Note ? ? ?CSN: [id number] ?Arrival date & time: 12/28/21  1919 ? ?  ? ?History ?Chief Complaint  ?Patient presents with  ? Shoulder Pain  ? Hand Pain  ? ? ?Robert Pratt is a 51 y.o. male presenting for a work note.  The patient was seen on 12-23-2021 for his shoulder and hand pain and was diagnosed with cervical radiculopathy.  The work note stated that he can return to work on 12-30-2021.  The patient reports that his boss reported that he needs to be reevaluated before he can return back to work.  The patient reports he does not have any shoulder and hand pain and that the steroid helped him greatly.  He reports he feels safe to go back to work.  He is not having any of the same symptoms he had before. ? ? ?Shoulder Pain ?Associated symptoms: no back pain, no fever and no neck pain   ?Hand Pain ? ? ?  ? ?Home Medications ?Prior to Admission medications   ?Medication Sig Start Date End Date Taking? Authorizing Provider  ?amLODipine (NORVASC) 10 MG tablet Take 1 tablet (10 mg total) by mouth daily. 07/29/21   Inda Coke, PA  ?atorvastatin (LIPITOR) 40 MG tablet Take 1 tablet (40 mg total) by mouth daily. 07/29/21   Inda Coke, PA  ?doxycycline (VIBRA-TABS) 100 MG tablet Take 1 tablet (100 mg total) by mouth 2 (two) times daily. 07/29/21   Inda Coke, PA  ?famotidine (PEPCID) 20 MG tablet TAKE 1 TABLET BY MOUTH TWICE A DAY 11/03/21   Inda Coke, PA  ?ibuprofen (ADVIL,MOTRIN) 200 MG tablet Take 400-600 mg by mouth every 6 (six) hours as needed.    [provider]  ?lisinopril-hydrochlorothiazide (ZESTORETIC) 20-25 MG tablet TAKE 1 TABLET BY MOUTH EVERY DAY 10/26/21   Inda Coke, PA  ?metFORMIN (GLUCOPHAGE) 500 MG tablet Take 1 tablet (500 mg total) by mouth 2 (two) times daily with a meal. 07/29/21   Inda Coke, PA  ?methocarbamol (ROBAXIN) 500 MG tablet Take 1 tablet (500 mg total) by mouth 2 (two) times daily. 12/23/21    Sponseller, Eugene Garnet R, PA-C  ?predniSONE (DELTASONE) 50 MG tablet Take 1 tablet (50 mg total) by mouth daily for 5 days. 12/23/21 12/28/21  Sponseller, Eugene Garnet R, PA-C  ?tirzepatide [facility]) 5 MG/0.5ML Pen Inject 5 mg into the skin once a week. 09/23/21   Inda Coke, PA  ?   ? ?Allergies    ?Patient has no known allergies.   ? ?Review of Systems   ?Review of Systems  ?Constitutional:  Negative for chills and fever.  ?Musculoskeletal:  Negative for arthralgias, back pain, joint swelling, myalgias and neck pain.  ?Neurological:  Negative for numbness.  ?All other systems reviewed and are negative. ? ?Physical Exam ?Updated Vital Signs ?BP (!) 147/93 (BP Location: Right Arm)   Pulse 82   Temp 99.6 ?F (37.6 ?C) (Oral)   Resp 18   SpO2 96%  ?Physical Exam ?Vitals and nursing note reviewed.  ?Constitutional:   ?   Appearance: Normal appearance.  ?HENT:  ?   Head: Normocephalic and atraumatic.  ?Eyes:  ?   General: No scleral icterus. ?Cardiovascular:  ?   Rate and Rhythm: Normal rate and regular rhythm.  ?Pulmonary:  ?   Effort: Pulmonary effort is normal.  ?   Breath sounds: Normal breath sounds.  ?Abdominal:  ?   General: Abdomen is flat. Bowel sounds are normal.  ?  Palpations: Abdomen is soft.  ?Musculoskeletal:     ?   General: No tenderness or deformity.  ?   Cervical back: Normal range of motion.  ?   Comments: Equal grip strength bilaterally.  Strength 5 out of 5 in upper and lower bilateral extremities.  Compartments are soft.  No pronator drift.  Full range of motion of his wrist elbows and shoulders bilaterally.  Nontender to palpation.  Pulses are intact bilaterally.  Sensation intact bilaterally.  Normal gait.  No midline or paraspinal cervical, thoracic, or lumbar tenderness.  ?Skin: ?   General: Skin is warm and dry.  ?Neurological:  ?   General: No focal deficit present.  ?   Mental Status: He is alert. Mental status is at baseline.  ?   Sensory: No sensory deficit.  ?   Motor: No weakness.  ?    Gait: Gait normal.  ? ? ?ED Results / Procedures / Treatments   ?Labs ?(all labs ordered are listed, but only abnormal results are displayed) ?Labs Reviewed - No data to display ? ?EKG ?None ? ?Radiology ?No results found. ? ?Procedures ?Procedures  ? ?Medications Ordered in ED ?Medications - No data to display ? ?ED Course/ Medical Decision Making/ A&P ?  ?                        ?Medical Decision Making ? ? ?51 year old male presents for reevaluation for return to work.  Not sure why the patient's boss will not take the work note that clearly states the patient can return to work on 12-30-2021.  Vital signs show mild hypertension otherwise stable.  Physical exam is unremarkable.  Patient has equal grip strength bilaterally.  Strength 5 out of 5 in upper and lower bilateral extremities.  Compartments are soft.  No pronator drift.  Full range of motion of his wrist elbows and shoulders bilaterally.  Nontender to palpation.  Pulses are intact bilaterally.  Sensation intact bilaterally.  Normal gait.  No midline or paraspinal cervical, thoracic, or lumbar tenderness.  ? ?The patient reports that he is feeling much better and would like to go back to work.  He has a normal physical exam.  He feels safe to go back to work.  I think this is reasonable given that note also says he can return to work on 12-30-2021.  We will provide him with another work note saying that he can go back on 12-30-2021.  Return precautions discussed with the patient.  The patient verbalized understanding and agrees to plan.  Patient is stable and being discharged home in good condition. ? ?Final Clinical Impression(s) / ED Diagnoses ?Final diagnoses:  ?Encounter to obtain excuse from work  ? ? ?Rx / DC Orders ?ED Discharge Orders   ? ? None  ? ?  ? ? ?  ?Sherrell Puller, PA-C ?01/02/22 4503 ? ?  ?Teressa Lower, MD ?01/04/22 [phone] ? ?

## 2021-12-28 NOTE — ED Triage Notes (Signed)
Pt reports he was seen here on 3/29 for hand & arm pain. Pt reports he is here today to be cleared to return back to work. Pt denies any pain. ?

## 2021-12-28 NOTE — Discharge Instructions (Addendum)
You were seen here today for re-evaluation. I'm glad you are feeling better. If you have any new or worsening symptoms, please return to the ER.  ?

## 2022-01-01 ENCOUNTER — Emergency Department (HOSPITAL_COMMUNITY)
Admission: EM | Admit: 2022-01-01 | Discharge: 2022-01-02 | Disposition: A | Discharge status: Home / Self Care | Payer: Managed Care, Other (non HMO) | Attending: Emergency Medicine | Admitting: Emergency Medicine

## 2022-01-01 ENCOUNTER — Encounter (HOSPITAL_COMMUNITY): Payer: Self-pay | Admitting: Emergency Medicine

## 2022-01-01 ENCOUNTER — Emergency Department (HOSPITAL_COMMUNITY): Payer: Managed Care, Other (non HMO)

## 2022-01-01 DIAGNOSIS — Z79899 Other long term (current) drug therapy: Secondary | ICD-10-CM | POA: Insufficient documentation

## 2022-01-01 DIAGNOSIS — M5441 Lumbago with sciatica, right side: Secondary | ICD-10-CM

## 2022-01-01 DIAGNOSIS — X500XXA Overexertion from strenuous movement or load, initial encounter: Secondary | ICD-10-CM | POA: Diagnosis not present

## 2022-01-01 DIAGNOSIS — I10 Essential (primary) hypertension: Secondary | ICD-10-CM

## 2022-01-01 DIAGNOSIS — M545 Low back pain, unspecified: Secondary | ICD-10-CM | POA: Diagnosis not present

## 2022-01-01 NOTE — ED Triage Notes (Signed)
Pt states he is having right side leg pain radiating to his lower back, no recent injury. ?

## 2022-01-02 MED ORDER — METHYLPREDNISOLONE SODIUM SUCC 125 MG IJ SOLR
125.0000 mg | Freq: Once | INTRAMUSCULAR | Status: AC
  Administered 2022-01-02: 125 mg via INTRAMUSCULAR
  Filled 2022-01-02: qty 2

## 2022-01-02 MED ORDER — CYCLOBENZAPRINE HCL 10 MG PO TABS: 10.0000 mg | ORAL_TABLET | Freq: Two times a day (BID) | ORAL | 0 refills | Status: DC | PRN

## 2022-01-02 MED ORDER — METHOCARBAMOL 500 MG PO TABS
500.0000 mg | ORAL_TABLET | Freq: Once | ORAL | Status: AC
  Administered 2022-01-02: 500 mg via ORAL
  Filled 2022-01-02: qty 1

## 2022-01-02 MED ORDER — KETOROLAC TROMETHAMINE 60 MG/2ML IM SOLN
15.0000 mg | Freq: Once | INTRAMUSCULAR | Status: AC
Start: 2022-01-02 — End: 2022-01-02
  Administered 2022-01-02: 15 mg via INTRAMUSCULAR
  Filled 2022-01-02: qty 2

## 2022-01-02 MED ORDER — PREDNISONE 20 MG PO TABS: ORAL_TABLET | ORAL | 0 refills | Status: DC

## 2022-01-02 NOTE — ED Provider Notes (Addendum)
?[place] ?Provider Note ? ? ?CSN: [id number] ?Arrival date & time: 01/01/22  2238 ? ?  ? ?History ? ?Chief Complaint  ?Patient presents with  ? Back Pain  ? ? ?Robert Pratt is a 51 y.o. male. ? ?51 year old male who presents to the ER today secondary to back pain that radiates down his leg.  Patient wonders if it was related to get hit in the leg on Tuesday with a tree branch.  States did not have any symptoms then but then yesterday he noticed while bending over to lift up something when he stood back up he had a sharp tingling type paresthesia went from his right lower back down his right leg to his knee.  Never any that this before.  No incontinence.  No IV drug use.  No other trauma.  No actual trauma to his back.  Patient states it seems to be worse with standing up straight and better with leaning over.  Symptoms hurts worse with walking.  Never had a that this before. ? ? ?Back Pain ? ?  ? ?Home Medications ?Prior to Admission medications   ?Medication Sig Start Date End Date Taking? Authorizing Provider  ?amLODipine (NORVASC) 10 MG tablet Take 1 tablet (10 mg total) by mouth daily. 07/29/21   Inda Coke, PA  ?atorvastatin (LIPITOR) 40 MG tablet Take 1 tablet (40 mg total) by mouth daily. 07/29/21   Inda Coke, PA  ?doxycycline (VIBRA-TABS) 100 MG tablet Take 1 tablet (100 mg total) by mouth 2 (two) times daily. 07/29/21   Inda Coke, PA  ?famotidine (PEPCID) 20 MG tablet TAKE 1 TABLET BY MOUTH TWICE A DAY 11/03/21   Inda Coke, PA  ?ibuprofen (ADVIL,MOTRIN) 200 MG tablet Take 400-600 mg by mouth every 6 (six) hours as needed.    [provider]  ?lisinopril-hydrochlorothiazide (ZESTORETIC) 20-25 MG tablet TAKE 1 TABLET BY MOUTH EVERY DAY 10/26/21   Inda Coke, PA  ?metFORMIN (GLUCOPHAGE) 500 MG tablet Take 1 tablet (500 mg total) by mouth 2 (two) times daily with a meal. 07/29/21   Inda Coke, PA  ?methocarbamol (ROBAXIN) 500 MG  tablet Take 1 tablet (500 mg total) by mouth 2 (two) times daily. 12/23/21   Sponseller, Eugene Garnet R, PA-C  ?tirzepatide [facility]) 5 MG/0.5ML Pen Inject 5 mg into the skin once a week. 09/23/21   Inda Coke, PA  ?   ? ?Allergies    ?Patient has no known allergies.   ? ?Review of Systems   ?Review of Systems  ?Musculoskeletal:  Positive for back pain.  ? ?Physical Exam ?Updated Vital Signs ?BP (!) 184/101   Pulse 76   Temp 97.7 ?F (36.5 ?C) (Oral)   Resp 16   SpO2 99%  ?Physical Exam ?Vitals and nursing note reviewed.  ?Constitutional:   ?   Appearance: He is well-developed.  ?HENT:  ?   Head: Normocephalic and atraumatic.  ?   Nose: No congestion or rhinorrhea.  ?   Mouth/Throat:  ?   Mouth: Mucous membranes are moist.  ?   Pharynx: Oropharynx is clear.  ?Eyes:  ?   Pupils: Pupils are equal, round, and reactive to light.  ?Cardiovascular:  ?   Rate and Rhythm: Normal rate.  ?Pulmonary:  ?   Effort: Pulmonary effort is normal. No respiratory distress.  ?Abdominal:  ?   General: Abdomen is flat. There is no distension.  ?Musculoskeletal:     ?   General: Normal range of motion.  ?  Cervical back: Normal range of motion.  ?Skin: ?   General: Skin is warm and dry.  ?   Coloration: Skin is not jaundiced or pale.  ?Neurological:  ?   General: No focal deficit present.  ?   Mental Status: He is alert.  ?   Comments: Ambulates without difficulty. ?Plantarflexion dorsiflexion are intact on right leg. ?Sensation to light touch in distal right leg is normal. ?Hip flexion and extension is normal. ?No saddle anesthesia.  ? ? ?ED Results / Procedures / Treatments   ?Labs ?(all labs ordered are listed, but only abnormal results are displayed) ?Labs Reviewed - No data to display ? ?EKG ?None ? ?Radiology ?DG FEMUR, MIN 2 VIEWS RIGHT ? ?Result Date: 01/01/2022 ?CLINICAL DATA:  Right leg pain, no known injury, initial encounter EXAM: RIGHT FEMUR 2 VIEWS COMPARISON:  08/31/2013 FINDINGS: No acute fracture or dislocation is  noted. Bipartite patella is noted. No soft tissue abnormality is seen. IMPRESSION: No acute abnormality noted. Electronically Signed   By: Inez Catalina M.D.   On: 01/01/2022 23:24   ? ?Procedures ?Procedures  ? ? ?Medications Ordered in ED ?Medications  ?methylPREDNISolone sodium succinate (SOLU-MEDROL) 125 mg/2 mL injection 125 mg (has no administration in time range)  ?ketorolac (TORADOL) injection 15 mg (has no administration in time range)  ?methocarbamol (ROBAXIN) tablet 500 mg (500 mg Oral Given 01/02/22 0026)  ? ? ?ED Course/ Medical Decision Making/ A&P ?  ?                        ?Medical Decision Making ?Risk ?Prescription drug management. ? ? ?Suspect likely sciatica. Will treat for same. No obvious bruising in area of concern. Pulses intact. Neuro intact, ambulates with normal gait.  ? ?Final Clinical Impression(s) / ED Diagnoses ?Final diagnoses:  ?None  ? ? ?Rx / DC Orders ?ED Discharge Orders   ? ? None  ? ?  ? ? ?  ?Chassie Pennix, Corene Cornea, MD ?01/02/22 0030 ? ?  ?Merrily Pew, MD ?01/02/22 0031 ? ?

## 2022-02-04 ENCOUNTER — Other Ambulatory Visit: Payer: Self-pay

## 2022-02-04 ENCOUNTER — Emergency Department (HOSPITAL_COMMUNITY)
Admission: EM | Admit: 2022-02-04 | Discharge: 2022-02-04 | Disposition: A | Discharge status: Home / Self Care | Payer: Managed Care, Other (non HMO) | Attending: Emergency Medicine | Admitting: Emergency Medicine

## 2022-02-04 ENCOUNTER — Ambulatory Visit (AMBULATORY_SURGERY_CENTER): Payer: Managed Care, Other (non HMO) | Admitting: *Deleted

## 2022-02-04 ENCOUNTER — Encounter (HOSPITAL_COMMUNITY): Payer: Self-pay

## 2022-02-04 VITALS — Ht 67.0 in | Wt 225.0 lb

## 2022-02-04 DIAGNOSIS — Z1211 Encounter for screening for malignant neoplasm of colon: Secondary | ICD-10-CM

## 2022-02-04 DIAGNOSIS — M25461 Effusion, right knee: Secondary | ICD-10-CM | POA: Diagnosis not present

## 2022-02-04 DIAGNOSIS — M25561 Pain in right knee: Secondary | ICD-10-CM | POA: Insufficient documentation

## 2022-02-04 MED ORDER — PEG 3350-KCL-NA BICARB-NACL 420 G PO SOLR: 4000.0000 mL | Freq: Once | ORAL | 0 refills | Status: AC

## 2022-02-04 MED ORDER — CYCLOBENZAPRINE HCL 10 MG PO TABS
10.0000 mg | ORAL_TABLET | Freq: Two times a day (BID) | ORAL | 0 refills | Status: DC | PRN
Start: 2022-02-04 — End: 2022-02-20

## 2022-02-04 MED ORDER — IBUPROFEN 600 MG PO TABS
600.0000 mg | ORAL_TABLET | Freq: Four times a day (QID) | ORAL | 0 refills | Status: DC | PRN
Start: 2022-02-04 — End: 2022-08-20

## 2022-02-04 NOTE — ED Provider Notes (Signed)
?[facility] ?Provider Note ? ? ?CSN: [id number] ?Arrival date & time: 02/04/22  1100 ? ?  ? ?History ? ?Chief Complaint  ?Patient presents with  ? Knee Pain  ? ? ?Robert Pratt is a 51 y.o. male. ? ?The history is provided by the patient. No language interpreter was used.  ?Knee Pain ? ?51 year old male who presents with complaint of right knee pain.  For the past 3 to 4 days patient has noticed increasing pain and some swelling to his right knee.  Pain is sharp and achy worse with ambulation.  He felt similar symptoms like this in the past and reportedly has had his fluid from his knee drawn off in the past.  No report of any fever no recent injury no rash.  No specific treatment tried.  Denies any change in any shoes orthotic. ? ?Home Medications ?Prior to Admission medications   ?Medication Sig Start Date End Date Taking? Authorizing Provider  ?amLODipine (NORVASC) 10 MG tablet Take 1 tablet (10 mg total) by mouth daily. 07/29/21   Inda Coke, PA  ?atorvastatin (LIPITOR) 40 MG tablet Take 1 tablet (40 mg total) by mouth daily. 07/29/21   Inda Coke, PA  ?lisinopril-hydrochlorothiazide (ZESTORETIC) 20-25 MG tablet TAKE 1 TABLET BY MOUTH EVERY DAY 10/26/21   Inda Coke, PA  ?metFORMIN (GLUCOPHAGE) 500 MG tablet Take 1 tablet (500 mg total) by mouth 2 (two) times daily with a meal. 07/29/21   Inda Coke, PA  ?polyethylene glycol-electrolytes (NULYTELY) 420 g solution Take 4,000 mLs by mouth once for 1 dose. 02/04/22 02/04/22  Armbruster, Carlota Raspberry, MD  ?   ? ?Allergies    ?Patient has no known allergies.   ? ?Review of Systems   ?Review of Systems  ?All other systems reviewed and are negative. ? ?Physical Exam ?Updated Vital Signs ?BP (!) 154/99 (BP Location: Right Arm)   Pulse 92   Temp 98.4 ?F (36.9 ?C) (Oral)   Resp 16   Ht '5\' 7"'$  (1.702 m)   Wt 102.1 kg   SpO2 97%   BMI 35.24 kg/m?  ?Physical Exam ?Vitals and nursing note reviewed.  ?Constitutional:    ?   General: He is not in acute distress. ?   Appearance: He is well-developed.  ?HENT:  ?   Head: Atraumatic.  ?Eyes:  ?   Conjunctiva/sclera: Conjunctivae normal.  ?Musculoskeletal:     ?   General: Tenderness (Right knee: Minimal tenderness about the right knee on palpation with normal knee flexion and extension.  Patella is located, no erythema edema or warmth noted.  No joint laxity.) present.  ?   Cervical back: Neck supple.  ?Skin: ?   Findings: No rash.  ?Neurological:  ?   Mental Status: He is alert.  ? ? ?ED Results / Procedures / Treatments   ?Labs ?(all labs ordered are listed, but only abnormal results are displayed) ?Labs Reviewed - No data to display ? ?EKG ?None ? ?Radiology ?No results found. ? ?Procedures ?Procedures  ? ? ?Medications Ordered in ED ?Medications - No data to display ? ?ED Course/ Medical Decision Making/ A&P ?  ?                        ?Medical Decision Making ? ?BP (!) 154/99 (BP Location: Right Arm)   Pulse 92   Temp 98.4 ?F (36.9 ?C) (Oral)   Resp 16   Ht '5\' 7"'$  (1.702 m)  Wt 102.1 kg   SpO2 97%   BMI 35.24 kg/m?  ? ?11:16 AM ?Patient here with atraumatic right knee pain ongoing for the past 3 to 4 days.  Has had similar pain to the same knee in the past.  On exam no evidence to suggest septic joint, gout, cellulitis, or other infectious symptoms.  No recent injury and no deformity noted.  Patella is located.  Patient able to ambulate without difficulty.  Plan to provide knee sleeve for support, RICE therapy discussed and outpatient orthopedic referral given as needed.  I have considered x-ray of the right knee but felt it is low yield due to lack of trauma.  I have consider basic labs but I have low suspicion for infectious symptoms. ? ? ? ? ? ? ? ?Final Clinical Impression(s) / ED Diagnoses ?Final diagnoses:  ?Acute pain of right knee  ? ? ?Rx / DC Orders ?ED Discharge Orders   ? ?      Ordered  ?  ibuprofen (ADVIL) 600 MG tablet  Every 6 hours PRN       ? 02/04/22 1118   ?  cyclobenzaprine (FLEXERIL) 10 MG tablet  2 times daily PRN       ? 02/04/22 1118  ? ?  ?  ? ?  ? ? ?  ?Domenic Moras, PA-C ?02/04/22 1120 ? ?  ?Charlesetta Shanks, MD ?02/05/22 1309 ? ?

## 2022-02-04 NOTE — ED Notes (Signed)
Knee brace placed by chris, NT ?

## 2022-02-04 NOTE — Discharge Instructions (Signed)
Please wear knee sleeves for support, take ibuprofen and muscle relaxant as needed for pain.  Keep your knee elevated at rest.  Follow-up with orthopedist if your symptoms persist. ?

## 2022-02-04 NOTE — Progress Notes (Signed)
Patient's pre-visit was done today over the phone with the patient. Name,DOB and address verified. Patient denies any allergies to Eggs and Soy. Patient denies any problems with anesthesia/sedation. Patient is not taking any diet pills or blood thinners. No home Oxygen. Insurance confirmed with patient. ? ?Prep instructions sent to pt's MyChart (if available) & mailed to pt-pt is aware. Patient understands to call us back with any questions or concerns. Patient is aware of our care-partner policy.  ? ?EMMI education assigned to the patient for the procedure, sent to [facility].  ? ?The patient is COVID-19 vaccinated.   ?

## 2022-02-04 NOTE — ED Triage Notes (Signed)
Pt arrived POV from home c/o right knee numbness and pain that started Sunday. Pt states he had this happen in the past and had to have fluid drawn off his knee.  ?

## 2022-02-16 ENCOUNTER — Encounter: Payer: Self-pay | Admitting: Gastroenterology

## 2022-02-18 ENCOUNTER — Ambulatory Visit: Payer: Managed Care, Other (non HMO) | Admitting: Gastroenterology

## 2022-02-18 ENCOUNTER — Encounter: Payer: Self-pay | Admitting: Gastroenterology

## 2022-02-18 VITALS — BP 178/99 | HR 78 | Temp 98.4°F | Resp 18 | Ht 67.0 in | Wt 225.0 lb

## 2022-02-18 DIAGNOSIS — Z1211 Encounter for screening for malignant neoplasm of colon: Secondary | ICD-10-CM

## 2022-02-18 MED ORDER — SODIUM CHLORIDE 0.9 % IV SOLN: 500.0000 mL | Freq: Once | INTRAVENOUS | Status: DC

## 2022-02-18 MED ORDER — NA SULFATE-K SULFATE-MG SULF 17.5-3.13-1.6 GM/177ML PO SOLN: 1.0000 | Freq: Once | ORAL | 0 refills | Status: AC

## 2022-02-18 NOTE — Progress Notes (Signed)
VS completed by DT.  Pt's states no medical or surgical changes since previsit or office visit.  

## 2022-02-18 NOTE — Progress Notes (Signed)
Pt arrived in recovery after proc cancelled due to elevated BP . Colonoscopy rescheduled for 2 months by Alvester Chou ,RN as ordered by Dr Havery Moros. Meanwhole pt has appoint with PCP re BP control .

## 2022-02-18 NOTE — Progress Notes (Signed)
ADDENDUM: While waiting to begin the procedure, in the procedure room the patient's blood pressure was much higher than it was in the pre-procedure check-in area. Previously 156/93, since has been 200s / low 100s in the procedure room. He is asymptomatic. He has taken his BP meds this morning. He denies headache or shortness of breath. We cycled his blood pressure for 10 minutes or so, remained mostly 200s / low 100s, and then reduced to 160s / 90s. Discussed with the patient. He is concerned about his BP, fluctuations, he is not comfortable proceeding with anesthesia today and wanted to postpone the exam. He reports he takes his BP routinely at home and usually 140s / 90s. He is going to call his primary care today about this to determine adjustments in his regimen, monitor BP at home. If > 200s / 100s again, especially with symptoms, he should go to the ED. He will be rescheduled for colonoscopy at another time once BP better controlled. We discussed it is possible he could have component of anxiety / white coat HTN contributing to this prior to the procedure, but he is not comfortable proceeding until this is addressed which is reasonable today. We will have him change up in his regular clothes, check BP again prior to leaving the facility.

## 2022-02-18 NOTE — Progress Notes (Signed)
Pt with BP of 423+ systolic and 953+ diastolic consistent in procedure room.  Pt took BP medication at 0230.  Pt wanted to reschedule due to high BP.  Recommended pt see his PCP immediately.  Case cancelled

## 2022-02-18 NOTE — Patient Instructions (Addendum)
Procedure cancelled due to elevated BP- see notes . Pt resceduled for Colonoscopy in 2 months as ordered by Dr Havery Moros, to see primary care physician concerning BP control .prep instructions for Colonoscopy ordered for 2 months reviewed w/ pt and copy of these instructions given to pt . Pt d/c'd to car by w/c

## 2022-02-18 NOTE — Progress Notes (Signed)
[facility] History and Physical   Primary Care Physician:  Inda Coke, [place]   Reason for Procedure:   Colon cancer screening  Plan:    colonoscopy     HPI: Robert Pratt is a 51 y.o. male  here for colonoscopy screening - first time exam.  Patient denies any bowel symptoms at this time. No family history of colon cancer known. Otherwise feels well without any cardiopulmonary symptoms.    Past Medical History:  Diagnosis Date   Diabetes mellitus without complication ([facility])    Hypertension     Past Surgical History:  Procedure Laterality Date   NO PAST SURGERIES      Prior to Admission medications   Medication Sig Start Date End Date Taking? Authorizing Provider  amLODipine (NORVASC) 10 MG tablet Take 1 tablet (10 mg total) by mouth daily. 07/29/21  Yes Inda Coke, PA  atorvastatin (LIPITOR) 40 MG tablet Take 1 tablet (40 mg total) by mouth daily. 07/29/21  Yes Morene Rankins, Aldona Bar, PA  lisinopril-hydrochlorothiazide (ZESTORETIC) 20-25 MG tablet TAKE 1 TABLET BY MOUTH EVERY DAY 10/26/21  Yes Inda Coke, PA  metFORMIN (GLUCOPHAGE) 500 MG tablet Take 1 tablet (500 mg total) by mouth 2 (two) times daily with a meal. 07/29/21  Yes Inda Coke, PA  cyclobenzaprine (FLEXERIL) 10 MG tablet Take 1 tablet (10 mg total) by mouth 2 (two) times daily as needed for muscle spasms. 02/04/22   Domenic Moras, PA-C  ibuprofen (ADVIL) 600 MG tablet Take 1 tablet (600 mg total) by mouth every 6 (six) hours as needed. 02/04/22   Domenic Moras, PA-C    Current Outpatient Medications  Medication Sig Dispense Refill   amLODipine (NORVASC) 10 MG tablet Take 1 tablet (10 mg total) by mouth daily. 90 tablet 1   atorvastatin (LIPITOR) 40 MG tablet Take 1 tablet (40 mg total) by mouth daily. 90 tablet 1   lisinopril-hydrochlorothiazide (ZESTORETIC) 20-25 MG tablet TAKE 1 TABLET BY MOUTH EVERY DAY 90 tablet 0   metFORMIN (GLUCOPHAGE) 500 MG tablet Take 1 tablet (500 mg total) by mouth  2 (two) times daily with a meal. 180 tablet 1   cyclobenzaprine (FLEXERIL) 10 MG tablet Take 1 tablet (10 mg total) by mouth 2 (two) times daily as needed for muscle spasms. 20 tablet 0   ibuprofen (ADVIL) 600 MG tablet Take 1 tablet (600 mg total) by mouth every 6 (six) hours as needed. 30 tablet 0   Current Facility-Administered Medications  Medication Dose Route Frequency Provider Last Rate Last Admin   0.9 %  sodium chloride infusion  500 mL Intravenous Once Abrial Arrighi, Carlota Raspberry, MD        Allergies as of 02/18/2022   (No Known Allergies)    Family History  Problem Relation Age of Onset   Diabetes Mother    Hypertension Mother    Cancer Mother    Cancer Father        bone, liver   Diabetes Father    Hypertension Father    Prostate cancer Neg Hx    Colon cancer Neg Hx    Esophageal cancer Neg Hx    Rectal cancer Neg Hx     Social History   Socioeconomic History   Marital status: Married    Spouse name: Not on file   Number of children: Not on file   Years of education: Not on file   Highest education level: Not on file  Occupational History   Not on file  Tobacco Use  Smoking status: Every Day    Packs/day: 0.50    Years: 30.00    Pack years: 15.00    Types: Cigarettes    Last attempt to quit: 10/12/2019    Years since quitting: 2.3   Smokeless tobacco: Never  Vaping Use   Vaping Use: Never used  Substance and Sexual Activity   Alcohol use: No   Drug use: No   Sexual activity: Yes  Other Topics Concern   Not on file  Social History Narrative   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  Physical Exam: Vital signs BP (!) 156/93   Pulse 69   Temp 98.4 F (36.9 C) (Temporal)   Ht '5\' 7"'$  (1.702 m)   Wt 225 lb (102.1 kg)   SpO2 97%   BMI 35.24 kg/m   General:   Alert,  Well-developed, pleasant and cooperative in NAD Lungs:  Clear throughout to auscultation.   Heart:  Regular rate and rhythm Abdomen:  Soft, nontender and nondistended.   Neuro/Psych:  Alert and cooperative. Normal mood and affect. A and O x 3  Jolly Mango, MD [facility]

## 2022-02-19 ENCOUNTER — Telehealth: Payer: Self-pay | Admitting: *Deleted

## 2022-02-19 NOTE — Telephone Encounter (Signed)
  Follow up Call-     02/18/2022    8:08 AM  Call back number  Post procedure Call Back phone  # [phone]  Permission to leave phone message Yes   Procedure not done d/t blood pressure issues

## 2022-02-20 ENCOUNTER — Encounter (HOSPITAL_COMMUNITY): Payer: Self-pay | Admitting: Emergency Medicine

## 2022-02-20 ENCOUNTER — Other Ambulatory Visit: Payer: Self-pay

## 2022-02-20 ENCOUNTER — Emergency Department (HOSPITAL_COMMUNITY): Payer: Managed Care, Other (non HMO)

## 2022-02-20 ENCOUNTER — Emergency Department (HOSPITAL_COMMUNITY)
Admission: EM | Admit: 2022-02-20 | Discharge: 2022-02-20 | Disposition: A | Discharge status: Home / Self Care | Payer: Managed Care, Other (non HMO) | Attending: Emergency Medicine | Admitting: Emergency Medicine

## 2022-02-20 DIAGNOSIS — R799 Abnormal finding of blood chemistry, unspecified: Secondary | ICD-10-CM | POA: Insufficient documentation

## 2022-02-20 DIAGNOSIS — M25551 Pain in right hip: Secondary | ICD-10-CM | POA: Insufficient documentation

## 2022-02-20 DIAGNOSIS — M79604 Pain in right leg: Secondary | ICD-10-CM | POA: Insufficient documentation

## 2022-02-20 LAB — CBG MONITORING, ED: Glucose-Capillary: 151 mg/dL — ABNORMAL HIGH (ref 70–99)

## 2022-02-20 MED ORDER — PREDNISONE 20 MG PO TABS
40.0000 mg | ORAL_TABLET | Freq: Once | ORAL | Status: AC
  Administered 2022-02-20: 40 mg via ORAL
  Filled 2022-02-20: qty 2

## 2022-02-20 MED ORDER — PREDNISONE 20 MG PO TABS: ORAL_TABLET | ORAL | 0 refills | Status: DC

## 2022-02-20 MED ORDER — HYDROCODONE-ACETAMINOPHEN 5-325 MG PO TABS
1.0000 | ORAL_TABLET | Freq: Once | ORAL | Status: AC
  Administered 2022-02-20: 1 via ORAL
  Filled 2022-02-20: qty 1

## 2022-02-20 MED ORDER — METHOCARBAMOL 500 MG PO TABS: 1000.0000 mg | ORAL_TABLET | Freq: Every evening | ORAL | 0 refills | Status: DC | PRN

## 2022-02-20 NOTE — ED Provider Notes (Signed)
[facility] EMERGENCY DEPARTMENT Provider Note   CSN: [id number] Arrival date & time: 02/20/22  1340     History  Chief Complaint  Patient presents with   Hip Pain    Robert Pratt is a 51 y.o. male.  Patient presents emergency department today for evaluation of right-sided hip pain with radiation down into the right thigh.  Symptoms worsened about 3 to 4 days ago.  Patient has had recurrent pain and a couple of previous ED visits for pain in the hip and pain in the knee.  X-ray of the femur done on 01/01/2022 was negative.  Patient denies new injuries or falls.  Pain is described as burning in the thigh.  He thinks that he was temporarily improved with previous treatments including steroids.  He has not follow-up with an orthopedist.  He is able to walk.  Patient denies warning symptoms of back pain including: fecal incontinence, urinary retention or overflow incontinence, night sweats, waking from sleep with back pain, unexplained fevers or weight loss, h/o cancer, IVDU, recent trauma.         Home Medications Prior to Admission medications   Medication Sig Start Date End Date Taking? Authorizing Provider  amLODipine (NORVASC) 10 MG tablet Take 1 tablet (10 mg total) by mouth daily. 07/29/21   Inda Coke, PA  atorvastatin (LIPITOR) 40 MG tablet Take 1 tablet (40 mg total) by mouth daily. 07/29/21   Inda Coke, PA  cyclobenzaprine (FLEXERIL) 10 MG tablet Take 1 tablet (10 mg total) by mouth 2 (two) times daily as needed for muscle spasms. 02/04/22   Domenic Moras, PA-C  ibuprofen (ADVIL) 600 MG tablet Take 1 tablet (600 mg total) by mouth every 6 (six) hours as needed. 02/04/22   Domenic Moras, PA-C  lisinopril-hydrochlorothiazide (ZESTORETIC) 20-25 MG tablet TAKE 1 TABLET BY MOUTH EVERY DAY 10/26/21   Inda Coke, PA  metFORMIN (GLUCOPHAGE) 500 MG tablet Take 1 tablet (500 mg total) by mouth 2 (two) times daily with a meal. 07/29/21   Inda Coke, PA       Allergies    Patient has no known allergies.    Review of Systems   Review of Systems  Physical Exam Updated Vital Signs BP (!) 142/82 (BP Location: Left Arm)   Pulse 100   Temp 97.7 F (36.5 C) (Oral)   Resp 18   SpO2 99%   Physical Exam Vitals and nursing note reviewed.  Constitutional:      Appearance: He is well-developed.  HENT:     Head: Normocephalic and atraumatic.  Eyes:     Conjunctiva/sclera: Conjunctivae normal.  Abdominal:     Palpations: Abdomen is soft.     Tenderness: There is no abdominal tenderness. There is no right CVA tenderness or left CVA tenderness.  Musculoskeletal:        General: Normal range of motion.     Cervical back: Normal range of motion. No tenderness. Normal range of motion.     Thoracic back: No tenderness. Normal range of motion.     Lumbar back: No tenderness or bony tenderness. Normal range of motion.       Back:     Right hip: Tenderness present. Normal range of motion.     Comments: No step-off noted with palpation of spine. Tender to palpation over the lateral right pelvis.   Skin:    General: Skin is warm and dry.  Neurological:     Mental Status: He is alert.  Sensory: No sensory deficit.     Motor: No abnormal muscle tone.     Deep Tendon Reflexes: Reflexes are normal and symmetric.     Comments: 5/5 strength in entire lower extremities bilaterally. No sensation deficit.   Psychiatric:        Mood and Affect: Mood normal.    ED Results / Procedures / Treatments   Labs (all labs ordered are listed, but only abnormal results are displayed) Labs Reviewed  CBG MONITORING, ED - Abnormal; Notable for the following components:      Result Value   Glucose-Capillary 151 (*)    All other components within normal limits    EKG None  Radiology DG Lumbar Spine Complete  Result Date: 02/20/2022 CLINICAL DATA:  Back pain. EXAM: LUMBAR SPINE - COMPLETE 4+ VIEW COMPARISON:  None Available. FINDINGS: There is no  evidence of lumbar spine fracture. Mild retrolisthesis of L2 and L3. Mild anterolisthesis of L5. Multilevel degenerate disc disease with disc space narrowing and marginal osteophytes. IMPRESSION: 1.  No evidence of acute fracture. 2. Mild multilevel degenerate disc disease with disc space narrowing and marginal osteophytes. Electronically Signed   By: Keane Police D.O.   On: 02/20/2022 15:43   DG Hip Unilat W or Wo Pelvis 2-3 Views Right  Result Date: 02/20/2022 CLINICAL DATA:  Right hip pain radiating to right knee for 2 days EXAM: DG HIP (WITH OR WITHOUT PELVIS) 2-3V RIGHT COMPARISON:  01/01/2022 FINDINGS: Frontal view of the pelvis as well as frontal and frogleg lateral views of the right hip are obtained. No acute fracture, subluxation, or dislocation. Joint spaces are well preserved. Sacroiliac joints are normal. IMPRESSION: 1. Unremarkable pelvis and right hip. Electronically Signed   By: Randa Ngo M.D.   On: 02/20/2022 15:28    Procedures Procedures    Medications Ordered in ED Medications  HYDROcodone-acetaminophen (NORCO/VICODIN) 5-325 MG per tablet 1 tablet (1 tablet Oral Given 02/20/22 1445)    ED Course/ Medical Decision Making/ A&P   Patient seen and examined. History obtained directly from patient.  Reviewed previous emergency department evaluation notes and x-ray from April.  Labs/EKG: None ordered  Imaging: X-ray of the right hip and lumbar spine.  Medications/Fluids: None ordered.  Most recent vital signs reviewed and are as follows: BP (!) 142/82 (BP Location: Left Arm)   Pulse 100   Temp 97.7 F (36.5 C) (Oral)   Resp 18   SpO2 99%   Initial impression: Right hip and thigh pain, possibly radicular in nature.  Patient has been seen for other pain in the right leg in the past.  Femur imaging negative.  4:53 PM Reassessment performed. Patient appears comfortable.  Imaging personally visualized and interpreted including: X-ray of the lumbar spine and  pelvis/hip.  Reviewed at bedside with patient.  CBG 151.  Most current vital signs reviewed and are as follows: BP (!) 142/82 (BP Location: Left Arm)   Pulse 100   Temp 97.7 F (36.5 C) (Oral)   Resp 18   SpO2 99%   Plan: Discharge to home.   Prescriptions written for: Prednisone x12 days, Robaxin  Patient counseled on proper use of muscle relaxant medication.  They were told not to drink alcohol, drive any vehicle, or do any dangerous activities while taking this medication.  Patient verbalized understanding.  Other home care instructions discussed: Patient was counseled on back pain precautions and told to do activity as tolerated but do not lift, push, or pull heavy objects more  than 10 pounds for the next week.  Patient counseled to use ice or heat on back for no longer than 15 minutes every hour.   Urged discontinue prednisone if blood sugar greater than 300.  ED return instructions discussed: Urged to return with worsening severe pain, loss of bowel or bladder control, trouble walking.   Follow-up instructions discussed: Patient urged to follow-up with PCP if pain does not improve with treatment and rest or if pain becomes recurrent.  Patient given neurosurgery referral.                             Medical Decision Making Amount and/or Complexity of Data Reviewed Radiology: ordered.   Patient with right leg pain. No motor deficits but does have radicular features. Patient is ambulatory. No warning symptoms of back pain including: fecal incontinence, urinary retention or overflow incontinence, night sweats, waking from sleep with back pain, unexplained fevers or weight loss, h/o cancer, IVDU, recent trauma. No concern for cauda equina, epidural abscess, or other serious cause of back pain. X-ray of hip and pelvis are negative.  X-ray of the lumbar spine suggestive of degenerative disease and osteophytes.  Conservative measures such as rest, ice/heat and pain medicine indicated  with PCP/neurosurgery follow-up if no improvement with conservative management.   Patient does report some improvement in past with steroids and will give tapered dose of p.o. prednisone.  Blood sugar mildly elevated at 151.         Final Clinical Impression(s) / ED Diagnoses Final diagnoses:  Right leg pain  Right hip pain    Rx / DC Orders ED Discharge Orders          Ordered    predniSONE (DELTASONE) 20 MG tablet        02/20/22 1651    methocarbamol (ROBAXIN) 500 MG tablet  At bedtime PRN        02/20/22 1651              Carlisle Cater, PA-C 02/20/22 1656    Ezequiel Essex, MD 02/20/22 2147

## 2022-02-20 NOTE — ED Triage Notes (Signed)
C/o R hip pain that radiates to R knee x 2 days.  Denies injury.

## 2022-02-20 NOTE — Discharge Instructions (Addendum)
Please read and follow all provided instructions.  Your diagnoses today include:  1. Right leg pain   2. Right hip pain     Tests performed today include: Vital signs - see below for your results today X-ray of your hip: Looks good, no problems X-ray of your lower back: Suggests arthritis and disc disease Blood sugar test  Medications prescribed:  Prednisone - steroid medicine   It is best to take this medication in the morning to prevent sleeping problems. If you are diabetic, monitor your blood sugar closely and stop taking Prednisone if blood sugar is over 300. Take with food to prevent stomach upset.   Robaxin (methocarbamol) - muscle relaxer medication  DO NOT drive or perform any activities that require you to be awake and alert because this medicine can make you drowsy.    Take any prescribed medications only as directed.  Home care instructions:  Follow any educational materials contained in this packet Please rest, use ice or heat on your back for the next several days Do not lift, push, pull anything more than 10 pounds for the next week  Follow-up instructions: Please follow-up with your primary care provider in the next 1 week for further evaluation of your symptoms.   Return instructions:  SEEK IMMEDIATE MEDICAL ATTENTION IF YOU HAVE: New numbness, tingling, weakness, or problem with the use of your arms or legs Severe back pain not relieved with medications Loss control of your bowels or bladder Increasing pain in any areas of the body (such as chest or abdominal pain) Shortness of breath, dizziness, or fainting.  Worsening nausea (feeling sick to your stomach), vomiting, fever, or sweats Any other emergent concerns regarding your health   Additional Information:  Your vital signs today were: BP (!) 142/82 (BP Location: Left Arm)   Pulse 100   Temp 97.7 F (36.5 C) (Oral)   Resp 18   SpO2 99%  If your blood pressure (BP) was elevated above 135/85 this  visit, please have this repeated by your doctor within one month. --------------

## 2022-02-24 ENCOUNTER — Telehealth: Payer: Self-pay | Admitting: *Deleted

## 2022-02-24 MED ORDER — TIRZEPATIDE 5 MG/0.5ML ~~LOC~~ SOAJ: 5.0000 mg | SUBCUTANEOUS | 0 refills | Status: DC

## 2022-02-24 NOTE — Telephone Encounter (Signed)
Spoke to pt told him Rx for Mounjaro 5 mg has been approved by insurance for one year. Told pt I will let the pharmacy know and you should call to see how much it will be, if affordable let me know and I will get a sample out for you for the Mounjaro 2.5 mg to start with since it has been awhile since you took it. Pt verbalized understanding.  Called [facility] and spoke to [place] told her PA for Mounjaro 5 mg has been approved. Lanelle Bal verbalized understanding and said it went through. Asked her how much it will be for patient? Lanelle Bal said $35.00. Told her okay.  Called pt back told him I called the pharmacy and it will be $35.00 for the [facility]. Pt verbalized understanding and said that is not bad. Told him to stop by today to pick up sample of Mounjaro 2.5 mg to start. Pt verbalized understanding.

## 2022-02-24 NOTE — Telephone Encounter (Signed)
Received fax from pharmacy need PA for Mounjaro 5 mg/0.5 ml. PA done thru Covermymeds. Key: [id number] This request has been approved using information available on the patient's profile. HYIFOY:[id number];MVEHMC:[id number];Review Type:Prior Auth;Coverage Start Date:01/25/2022;Coverage End Date:02/24/2023;

## 2022-03-01 NOTE — Progress Notes (Signed)
Visit was canceled due to need for sports medicine.  Appt secured for 2p today with Dr. Georgina Snell.  Will defer treatment to him.  Inda Coke PA-C

## 2022-03-02 ENCOUNTER — Ambulatory Visit: Payer: Self-pay

## 2022-03-02 ENCOUNTER — Ambulatory Visit (INDEPENDENT_AMBULATORY_CARE_PROVIDER_SITE_OTHER): Payer: Managed Care, Other (non HMO)

## 2022-03-02 ENCOUNTER — Ambulatory Visit (INDEPENDENT_AMBULATORY_CARE_PROVIDER_SITE_OTHER): Payer: Managed Care, Other (non HMO) | Admitting: Physician Assistant

## 2022-03-02 ENCOUNTER — Ambulatory Visit (INDEPENDENT_AMBULATORY_CARE_PROVIDER_SITE_OTHER): Payer: Managed Care, Other (non HMO) | Admitting: Family Medicine

## 2022-03-02 ENCOUNTER — Encounter: Payer: Self-pay | Admitting: Physician Assistant

## 2022-03-02 VITALS — BP 156/94 | HR 82 | Temp 98.3°F | Ht 67.0 in | Wt 226.4 lb

## 2022-03-02 VITALS — BP 156/94 | HR 82 | Ht 67.0 in | Wt 223.0 lb

## 2022-03-02 DIAGNOSIS — M25561 Pain in right knee: Secondary | ICD-10-CM | POA: Diagnosis not present

## 2022-03-02 DIAGNOSIS — M5416 Radiculopathy, lumbar region: Secondary | ICD-10-CM

## 2022-03-02 MED ORDER — GABAPENTIN 300 MG PO CAPS: 300.0000 mg | ORAL_CAPSULE | Freq: Three times a day (TID) | ORAL | 2 refills | Status: DC

## 2022-03-02 NOTE — Telephone Encounter (Signed)
Pt here today for visit, sample given of Mounjaro 2.5 mg Lot # [id number], Exp: 09/08/2023.

## 2022-03-02 NOTE — Progress Notes (Signed)
I, Robert Pratt, LAT, ATC acting as a scribe for Robert Leader, MD.  Subjective:    CC: R leg pain  HPI: Pt is a 51 y/o male c/o R leg pain that worsened around mid-May. Pt was seen at the [facility] ED on 5/27 c/o R leg pain, starting at the R hip and radiating along the R thigh and was given a dose of hydrocodone. Imaging was obtained and he was prescribed a 12 day course of prednisone and Robaxin. Pt locates pain to the R-side of the low back, w/ radiating and burning pain along the anterior and lateral thigh. Pt drives a fork lift for work.   Radiates: yes LE numbness/tingling: yes LE weakness: no- feels like R leg is "heavy or "asleep" Aggravates: everything Treatments tried: prednisone, Robaxin,   Pt also c/o R knee pain. Pt locates pain to the anterior aspect of the R knee.   R knee swelling: yes Mechanical symptoms: no Aggravates: knee flexion, walking Treatments: compression wrap, knee brace, prednisone  Dx imaging: 02/20/22 R hip & L-spine XR  01/01/22 R femur XR  Pertinent review of Systems: No fevers or chills  Relevant historical information: Diabetes   Objective:    Vitals:   03/02/22 1349  BP: (!) 156/94  Pulse: 82  SpO2: 96%   General: Well Developed, well nourished, and in no acute distress.   MSK: L-spine: Nontender midline. Normal lumbar motion. Lower extremity strength is intact and reflexes are intact. Right hip normal.  Normal hip motion.  Right knee: Normal appearing Normal motion. Tender palpation medial and lateral joint line Stable ligaments exam. Intact strength.  Lab and Radiology Results  Procedure: Real-time Ultrasound Guided Injection of right knee superior lateral patellar space Device: Philips Affiniti 50G Images permanently stored and available for review in PACS Ultrasound evaluation prior to injection reveals narrowed medial joint line. Verbal informed consent obtained.  Discussed risks and benefits of procedure. Warned  about infection, bleeding, hyperglycemia damage to structures among others. Patient expresses understanding and agreement Time-out conducted.   Noted no overlying erythema, induration, or other signs of local infection.   Skin prepped in a sterile fashion.   Local anesthesia: Topical Ethyl chloride.   With sterile technique and under real time ultrasound guidance: 40 mg of Kenalog and 2 mL of Marcaine injected into the knee joint. Fluid seen entering the joint capsule.   Completed without difficulty   Pain immediately resolved suggesting accurate placement of the medication.   Advised to call if fevers/chills, erythema, induration, drainage, or persistent bleeding.   Images permanently stored and available for review in the ultrasound unit.  Impression: Technically successful ultrasound guided injection.      X-ray images right knee obtained today personally and independently interpreted Bipartite patella.  Mild medial patellofemoral DJD.  No acute fractures. Await formal radiology review  EXAM: LUMBAR SPINE - COMPLETE 4+ VIEW   COMPARISON:  None Available.   FINDINGS: There is no evidence of lumbar spine fracture. Mild retrolisthesis of L2 and L3. Mild anterolisthesis of L5. Multilevel degenerate disc disease with disc space narrowing and marginal osteophytes.   IMPRESSION: 1.  No evidence of acute fracture.   2. Mild multilevel degenerate disc disease with disc space narrowing and marginal osteophytes.     Electronically Signed   By: Keane Police D.O.   On: 02/20/2022 15:43  EXAM: DG HIP (WITH OR WITHOUT PELVIS) 2-3V RIGHT   COMPARISON:  01/01/2022   FINDINGS: Frontal view of the  pelvis as well as frontal and frogleg lateral views of the right hip are obtained. No acute fracture, subluxation, or dislocation. Joint spaces are well preserved. Sacroiliac joints are normal.   IMPRESSION: 1. Unremarkable pelvis and right hip.     Electronically Signed   By:  Randa Ngo M.D.   On: 02/20/2022 15:28  I, Robert Pratt, personally (independently) visualized and performed the interpretation of the images attached in this note.     Impression and Recommendations:    Assessment and Plan: 51 y.o. male with  Right anterior thigh pain concerning for L2 or L3 lumbar radiculopathy.  He does have degenerative changes on his lumbar spine x-ray at these levels that could cause similar symptoms.  He has had ongoing pain in this region for months and has been seen in the emergency room several times and his primary care provider office for this.  He said trial of prednisone and an exercise program which has failed to improve.  Plan for MRI to further characterize source of pain and for further treatment plan and options.  Limited trial of gabapentin now for pain control.  Likely proceed with epidural steroid injection after MRI.  Additionally has knee pain and swelling thought to be due to DJD.  Gout is a possibility.  Plan for steroid injection.  Will check uric acid in the near future.  Recheck in about a month.  Return sooner if needed.  PDMP not reviewed this encounter. Orders Placed This Encounter  Procedures   [place] LIMITED JOINT SPACE STRUCTURES LOW RIGHT(NO LINKED CHARGES)    Order Specific Question:   Reason for Exam (SYMPTOM  OR DIAGNOSIS REQUIRED)    Answer:   right knee pain    Order Specific Question:   Preferred imaging location?    Answer:   [facility]   DG Knee AP/LAT W/Sunrise Right    Standing Status:   Future    Number of Occurrences:   1    Standing Expiration Date:   04/01/2022    Order Specific Question:   Reason for Exam (SYMPTOM  OR DIAGNOSIS REQUIRED)    Answer:   right knee pain    Order Specific Question:   Preferred imaging location?    Answer:   [facility]   MR Lumbar Spine Wo Contrast    Standing Status:   Future    Standing Expiration Date:   03/03/2023    Order Specific Question:   What is  the patient's sedation requirement?    Answer:   No Sedation    Order Specific Question:   Does the patient have a pacemaker or implanted devices?    Answer:   No    Order Specific Question:   Preferred imaging location?    Answer:   [facility] development [facility] (table limit-350lbs)   Meds ordered this encounter  Medications   gabapentin (NEURONTIN) 300 MG capsule    Sig: Take 1 capsule (300 mg total) by mouth 3 (three) times daily.    Dispense:  30 capsule    Refill:  2    Discussed warning signs or symptoms. Please see discharge instructions. Patient expresses understanding.   The above documentation has been reviewed and is accurate and complete Robert Pratt, M.D.

## 2022-03-02 NOTE — Patient Instructions (Addendum)
Thank you for coming in today.   Please get an Xray today before you leave   You received a steroid injection in your right knee today. Seek immediate medical attention if the joint becomes red, extremely painful, or is oozing fluid.   I've sent a prescription for Gabapentin to your pharmacy.   You should hear from MRI scheduling within 1 week. If you do not hear please let me know.    Check back after MRI or in 1 month

## 2022-03-02 NOTE — Patient Instructions (Addendum)
It was great to see you!  Your appt is at 2p with Dr. Georgina Snell  Their location:  [place] at [facility]  [address] on the 1st floor Phone number [phone] Fax [phone].   This location is across the street from the entrance to [employer] and in the same complex as the [facility]

## 2022-03-04 NOTE — Progress Notes (Signed)
Right knee x-ray shows some medial arthritis changes.  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

## 2022-03-12 ENCOUNTER — Other Ambulatory Visit: Payer: Self-pay | Admitting: Family Medicine

## 2022-03-13 ENCOUNTER — Other Ambulatory Visit: Payer: Managed Care, Other (non HMO)

## 2022-03-16 ENCOUNTER — Ambulatory Visit (INDEPENDENT_AMBULATORY_CARE_PROVIDER_SITE_OTHER): Payer: Managed Care, Other (non HMO)

## 2022-03-16 DIAGNOSIS — M545 Low back pain, unspecified: Secondary | ICD-10-CM

## 2022-03-16 DIAGNOSIS — M5416 Radiculopathy, lumbar region: Secondary | ICD-10-CM

## 2022-03-18 ENCOUNTER — Telehealth: Payer: Self-pay | Admitting: Family Medicine

## 2022-03-18 DIAGNOSIS — M5416 Radiculopathy, lumbar region: Secondary | ICD-10-CM

## 2022-03-18 NOTE — Progress Notes (Signed)
MRI shows bulging disks at multiple levels that could cause pinched nerves.  There certainly could be a pinched nerve causing her symptoms.  I have ordered an epidural steroid injection which I think could help.  Please call [phone] to schedule the injection.

## 2022-03-18 NOTE — Telephone Encounter (Signed)
Epidural steroid injection ordered 

## 2022-03-23 ENCOUNTER — Ambulatory Visit
Admission: RE | Admit: 2022-03-23 | Discharge: 2022-03-23 | Disposition: A | Discharge status: Home / Self Care | Payer: Managed Care, Other (non HMO) | Source: Ambulatory Visit | Attending: Family Medicine | Admitting: Family Medicine

## 2022-03-23 DIAGNOSIS — M5416 Radiculopathy, lumbar region: Secondary | ICD-10-CM

## 2022-03-23 MED ORDER — IOPAMIDOL (ISOVUE-M 200) INJECTION 41%: 1.0000 mL | Freq: Once | INTRAMUSCULAR | Status: DC

## 2022-03-23 MED ORDER — METHYLPREDNISOLONE ACETATE 40 MG/ML INJ SUSP (RADIOLOG: 80.0000 mg | Freq: Once | INTRAMUSCULAR | Status: DC

## 2022-04-04 ENCOUNTER — Other Ambulatory Visit: Payer: Self-pay | Admitting: Physician Assistant

## 2022-04-05 NOTE — Telephone Encounter (Signed)
Spoke to pt told him received refill request for Mounjaro 5 mg but are you ready to increase dose to 7.5 mg? Pt said yes. Told him will send new dose to pharmacy. Pt verbalized understanding.

## 2022-04-18 ENCOUNTER — Encounter: Payer: Self-pay | Admitting: Certified Registered Nurse Anesthetist

## 2022-04-22 ENCOUNTER — Encounter: Payer: Self-pay | Admitting: Gastroenterology

## 2022-04-22 ENCOUNTER — Ambulatory Visit: Payer: Managed Care, Other (non HMO) | Admitting: Gastroenterology

## 2022-04-22 VITALS — BP 121/79 | HR 84 | Temp 97.2°F | Resp 14 | Ht 67.0 in | Wt 223.0 lb

## 2022-04-22 DIAGNOSIS — D127 Benign neoplasm of rectosigmoid junction: Secondary | ICD-10-CM | POA: Diagnosis not present

## 2022-04-22 DIAGNOSIS — D123 Benign neoplasm of transverse colon: Secondary | ICD-10-CM

## 2022-04-22 DIAGNOSIS — Z1211 Encounter for screening for malignant neoplasm of colon: Secondary | ICD-10-CM

## 2022-04-22 DIAGNOSIS — D120 Benign neoplasm of cecum: Secondary | ICD-10-CM | POA: Diagnosis not present

## 2022-04-22 DIAGNOSIS — K635 Polyp of colon: Secondary | ICD-10-CM | POA: Diagnosis not present

## 2022-04-22 MED ORDER — SODIUM CHLORIDE 0.9 % IV SOLN: 500.0000 mL | Freq: Once | INTRAVENOUS | Status: DC

## 2022-04-22 NOTE — Progress Notes (Signed)
[facility] History and Physical   Primary Care Physician:  Inda Coke, [place]   Reason for Procedure:   CRC screening  Plan:    colonoscopy     HPI: Robert Pratt is a 51 y.o. male  here for colonoscopy screening - first time exam. Patient denies any bowel symptoms at this time. No family history of colon cancer known. Otherwise feels well without any cardiopulmonary symptoms.    Past Medical History:  Diagnosis Date   Diabetes mellitus without complication ([place])    Hypertension     Past Surgical History:  Procedure Laterality Date   NO PAST SURGERIES      Prior to Admission medications   Medication Sig Start Date End Date Taking? Authorizing Provider  amLODipine (NORVASC) 10 MG tablet Take 1 tablet (10 mg total) by mouth daily. 07/29/21  Yes Inda Coke, PA  atorvastatin (LIPITOR) 40 MG tablet Take 1 tablet (40 mg total) by mouth daily. 07/29/21  Yes Worley, Aldona Bar, PA  lisinopril-hydrochlorothiazide (ZESTORETIC) 20-25 MG tablet TAKE 1 TABLET BY MOUTH EVERY DAY 10/26/21  Yes Inda Coke, PA  metFORMIN (GLUCOPHAGE) 500 MG tablet Take 1 tablet (500 mg total) by mouth 2 (two) times daily with a meal. 07/29/21  Yes Morene Rankins, Strang, PA  tirzepatide [facility]) 7.5 MG/0.5ML Pen Inject 7.5 mg into the skin once a week. 04/05/22  Yes Inda Coke, PA  famotidine (PEPCID) 20 MG tablet Take 20 mg by mouth 2 (two) times daily. 02/18/22   [provider]  gabapentin (NEURONTIN) 300 MG capsule TAKE 1 CAPSULE BY MOUTH THREE TIMES A DAY 03/15/22   Gregor Hams, MD  ibuprofen (ADVIL) 600 MG tablet Take 1 tablet (600 mg total) by mouth every 6 (six) hours as needed. 02/04/22   Domenic Moras, PA-C  methocarbamol (ROBAXIN) 500 MG tablet Take 2 tablets (1,000 mg total) by mouth at bedtime as needed for muscle spasms. 02/20/22   Carlisle Cater, PA-C  predniSONE (DELTASONE) 20 MG tablet 3 Tabs PO Days 1-3, then 2 tabs PO Days 4-6, then 1 tab PO Day 7-9, then Half Tab  PO Day 10-12 Patient not taking: Reported on 03/02/2022 02/20/22   Carlisle Cater, PA-C    Current Outpatient Medications  Medication Sig Dispense Refill   amLODipine (NORVASC) 10 MG tablet Take 1 tablet (10 mg total) by mouth daily. 90 tablet 1   atorvastatin (LIPITOR) 40 MG tablet Take 1 tablet (40 mg total) by mouth daily. 90 tablet 1   lisinopril-hydrochlorothiazide (ZESTORETIC) 20-25 MG tablet TAKE 1 TABLET BY MOUTH EVERY DAY 90 tablet 0   metFORMIN (GLUCOPHAGE) 500 MG tablet Take 1 tablet (500 mg total) by mouth 2 (two) times daily with a meal. 180 tablet 1   tirzepatide (MOUNJARO) 7.5 MG/0.5ML Pen Inject 7.5 mg into the skin once a week. 2 mL 0   famotidine (PEPCID) 20 MG tablet Take 20 mg by mouth 2 (two) times daily.     gabapentin (NEURONTIN) 300 MG capsule TAKE 1 CAPSULE BY MOUTH THREE TIMES A DAY 30 capsule 2   ibuprofen (ADVIL) 600 MG tablet Take 1 tablet (600 mg total) by mouth every 6 (six) hours as needed. 30 tablet 0   methocarbamol (ROBAXIN) 500 MG tablet Take 2 tablets (1,000 mg total) by mouth at bedtime as needed for muscle spasms. 20 tablet 0   predniSONE (DELTASONE) 20 MG tablet 3 Tabs PO Days 1-3, then 2 tabs PO Days 4-6, then 1 tab PO Day 7-9, then Half Tab PO  Day 10-12 (Patient not taking: Reported on 03/02/2022) 20 tablet 0   Current Facility-Administered Medications  Medication Dose Route Frequency Provider Last Rate Last Admin   0.9 %  sodium chloride infusion  500 mL Intravenous Once Caylor Tallarico, Carlota Raspberry, MD       0.9 %  sodium chloride infusion  500 mL Intravenous Once Juni Glaab, Carlota Raspberry, MD        Allergies as of 04/22/2022 - Review Complete 04/22/2022  Allergen Reaction Noted   No known allergies  04/22/2022    Family History  Problem Relation Age of Onset   Diabetes Mother    Hypertension Mother    Cancer Mother    Cancer Father        bone, liver   Diabetes Father    Hypertension Father    Prostate cancer Neg Hx    Colon cancer Neg Hx     Esophageal cancer Neg Hx    Rectal cancer Neg Hx     Social History   Socioeconomic History   Marital status: Married    Spouse name: Not on file   Number of children: Not on file   Years of education: Not on file   Highest education level: Not on file  Occupational History   Not on file  Tobacco Use   Smoking status: Every Day    Packs/day: 0.50    Years: 30.00    Total pack years: 15.00    Types: Cigarettes    Last attempt to quit: 10/12/2019    Years since quitting: 2.5   Smokeless tobacco: Never  Vaping Use   Vaping Use: Never used  Substance and Sexual Activity   Alcohol use: No   Drug use: No   Sexual activity: Yes  Other Topics Concern   Not on file  Social History Narrative   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  Physical Exam: Vital signs BP (!) 162/90   Pulse 76   Temp (!) 97.2 F (36.2 C) (Temporal)   Resp 15   Ht '5\' 7"'$  (1.702 m)   Wt 223 lb (101.2 kg)   SpO2 100%   BMI 34.93 kg/m   General:   Alert,  Well-developed, pleasant and cooperative in NAD Lungs:  Clear throughout to auscultation.   Heart:  Regular rate and rhythm Abdomen:  Soft, nontender and nondistended.   Neuro/Psych:  Alert and cooperative. Normal mood and affect. A and O x 3  Jolly Mango, MD [facility]

## 2022-04-22 NOTE — Progress Notes (Signed)
Vitals-CW  Pt's states no medical or surgical changes since previsit or office visit. 

## 2022-04-22 NOTE — Progress Notes (Signed)
Called to room to assist during endoscopic procedure.  Patient ID and intended procedure confirmed with present staff. Received instructions for my participation in the procedure from the performing physician.  

## 2022-04-22 NOTE — Patient Instructions (Signed)
Read all of the handouts given to you by your recovery room nurse.  YOU HAD AN ENDOSCOPIC PROCEDURE TODAY AT THE [facility]:   Refer to the procedure report that was given to you for any specific questions about what was found during the examination.  If the procedure report does not answer your questions, please call your gastroenterologist to clarify.  If you requested that your care partner not be given the details of your procedure findings, then the procedure report has been included in a sealed envelope for you to review at your convenience later.  YOU SHOULD EXPECT: Some feelings of bloating in the abdomen. Passage of more gas than usual.  Walking can help get rid of the air that was put into your GI tract during the procedure and reduce the bloating. If you had a lower endoscopy (such as a colonoscopy or flexible sigmoidoscopy) you may notice spotting of blood in your stool or on the toilet paper. If you underwent a bowel prep for your procedure, you may not have a normal bowel movement for a few days.  Please Note:  You might notice some irritation and congestion in your nose or some drainage.  This is from the oxygen used during your procedure.  There is no need for concern and it should clear up in a day or so.  SYMPTOMS TO REPORT IMMEDIATELY:  Following lower endoscopy (colonoscopy or flexible sigmoidoscopy):  Excessive amounts of blood in the stool  Significant tenderness or worsening of abdominal pains  Swelling of the abdomen that is new, acute  Fever of 100F or higher   For urgent or emergent issues, a gastroenterologist can be reached at any hour by calling [phone]. Do not use MyChart messaging for urgent concerns.    DIET:  We do recommend a small meal at first, but then you may proceed to your regular diet.  Drink plenty of fluids but you should avoid alcoholic beverages for 24 hours.  ACTIVITY:  You should plan to take it easy for the rest of today and  you should NOT DRIVE or use heavy machinery until tomorrow (because of the sedation medicines used during the test).    FOLLOW UP: Our staff will call the number listed on your records the next business day following your procedure.  We will call around 7:15- 8:00 am to check on you and address any questions or concerns that you may have regarding the information given to you following your procedure. If we do not reach you, we will leave a message.  If you develop any symptoms (ie: fever, flu-like symptoms, shortness of breath, cough etc.) before then, please call [phone].  If you test positive for Covid 19 in the 2 weeks post procedure, please call and report this information to us.    If any biopsies were taken you will be contacted by phone or by letter within the next 1-3 weeks.  Please call us at [phone] if you have not heard about the biopsies in 3 weeks.    SIGNATURES/CONFIDENTIALITY: You and/or your care partner have signed paperwork which will be entered into your electronic medical record.  These signatures attest to the fact that that the information above on your After Visit Summary has been reviewed and is understood.  Full responsibility of the confidentiality of this discharge information lies with you and/or your care-partner.  

## 2022-04-22 NOTE — Op Note (Signed)
Robert Pratt: Robert Pratt Procedure Date: 04/22/2022 11:53 AM MRN: [id number] Endoscopist: Remo Lipps P. Havery Moros , MD Age: 51 Referring MD:  Date of Birth: 1971/07/28 Gender: Male Account #: [id number] Procedure:                Colonoscopy Indications:              Screening for colorectal malignant neoplasm, This                            is the patient's first colonoscopy Medicines:                Monitored Anesthesia Care Procedure:                Pre-Anesthesia Assessment:                           - Prior to the procedure, a History and Physical                            was performed, and patient medications and                            allergies were reviewed. The patient's tolerance of                            previous anesthesia was also reviewed. The risks                            and benefits of the procedure and the sedation                            options and risks were discussed with the patient.                            All questions were answered, and informed consent                            was obtained. Prior Anticoagulants: The patient has                            taken no previous anticoagulant or antiplatelet                            agents. ASA Grade Assessment: II - A patient with                            mild systemic disease. After reviewing the risks                            and benefits, the patient was deemed in                            satisfactory condition to undergo the procedure.  After obtaining informed consent, the colonoscope                            was passed under direct vision. Throughout the                            procedure, the patient's blood pressure, pulse, and                            oxygen saturations were monitored continuously. The                            Olympus CF-HQ190L [id number]) Colonoscope was                            introduced through the  anus and advanced to the the                            cecum, identified by appendiceal orifice and                            ileocecal valve. The colonoscopy was performed                            without difficulty. The patient tolerated the                            procedure well. The quality of the bowel                            preparation was good. The ileocecal valve,                            appendiceal orifice, and rectum were photographed. Scope In: 12:00:35 PM Scope Out: 12:19:52 PM Scope Withdrawal Time: 0 hours 16 minutes 23 seconds  Total Procedure Duration: 0 hours 19 minutes 17 seconds  Findings:                 The perianal and digital rectal examinations were                            normal.                           A 3 mm polyp was found in the cecum. The polyp was                            sessile. The polyp was removed with a cold snare.                            Resection and retrieval were complete.                           A 3 mm polyp was found in the transverse colon. The  polyp was sessile. The polyp was removed with a                            cold snare. Resection and retrieval were complete.                           A 4 to 5 mm polyp was found in the splenic flexure.                            The polyp was sessile. The polyp was removed with a                            cold snare. Resection and retrieval were complete.                           A 3 mm polyp was found in the recto-sigmoid colon.                            The polyp was sessile. The polyp was removed with a                            cold snare. Resection and retrieval were complete.                           A few small-mouthed diverticula were found in the                            left colon.                           Anal papilla(e) were hypertrophied.                           Internal hemorrhoids were found during retroflexion.                            The exam was otherwise without abnormality. Complications:            No immediate complications. Estimated blood loss:                            Minimal. Estimated Blood Loss:     Estimated blood loss was minimal. Impression:               - One 3 mm polyp in the cecum, removed with a cold                            snare. Resected and retrieved.                           - One 3 mm polyp in the transverse colon, removed                            with a cold snare.  Resected and retrieved.                           - One 4 to 5 mm polyp at the splenic flexure,                            removed with a cold snare. Resected and retrieved.                           - One 3 mm polyp at the recto-sigmoid colon,                            removed with a cold snare. Resected and retrieved.                           - Diverticulosis in the left colon.                           - Anal papilla(e) were hypertrophied.                           - Internal hemorrhoids.                           - The examination was otherwise normal. Recommendation:           - Patient has a contact number available for                            emergencies. The signs and symptoms of potential                            delayed complications were discussed with the                            patient. Return to normal activities tomorrow.                            Written discharge instructions were provided to the                            patient.                           - Resume previous diet.                           - Continue present medications.                           - Await pathology results. Remo Lipps P. Jkwon Treptow, MD 04/22/2022 12:24:33 PM This report has been signed electronically.

## 2022-04-22 NOTE — Progress Notes (Signed)
Report given to PACU, vss 

## 2022-04-22 NOTE — Progress Notes (Signed)
1210 Ephedrine 10 mg given IV due to low BP, MD updated.

## 2022-04-23 ENCOUNTER — Telehealth: Payer: Self-pay

## 2022-04-23 NOTE — Telephone Encounter (Signed)
  Follow up Call-     04/22/2022   11:17 AM 02/18/2022    8:08 AM  Call back number  Post procedure Call Back phone  # [phone]  Permission to leave phone message Yes Yes     Patient questions:  Do you have a fever, pain , or abdominal swelling? No. Pain Score  0 *  Have you tolerated food without any problems? Yes.    Have you been able to return to your normal activities? Yes.    Do you have any questions about your discharge instructions: Diet   No. Medications  No. Follow up visit  No.  Do you have questions or concerns about your Care? No.  Actions: * If pain score is 4 or above: No action needed, pain <4.

## 2022-05-17 ENCOUNTER — Other Ambulatory Visit: Payer: Self-pay | Admitting: Physician Assistant

## 2022-05-18 ENCOUNTER — Other Ambulatory Visit: Payer: Self-pay | Admitting: Physician Assistant

## 2022-05-29 ENCOUNTER — Encounter (HOSPITAL_COMMUNITY): Payer: Self-pay | Admitting: Emergency Medicine

## 2022-05-29 ENCOUNTER — Emergency Department (HOSPITAL_COMMUNITY)
Admission: EM | Admit: 2022-05-29 | Discharge: 2022-05-29 | Disposition: A | Discharge status: Home / Self Care | Payer: Self-pay | Attending: Emergency Medicine | Admitting: Emergency Medicine

## 2022-05-29 ENCOUNTER — Emergency Department (HOSPITAL_COMMUNITY): Payer: Self-pay

## 2022-05-29 DIAGNOSIS — Y990 Civilian activity done for income or pay: Secondary | ICD-10-CM | POA: Insufficient documentation

## 2022-05-29 DIAGNOSIS — Z79899 Other long term (current) drug therapy: Secondary | ICD-10-CM | POA: Insufficient documentation

## 2022-05-29 DIAGNOSIS — M25512 Pain in left shoulder: Secondary | ICD-10-CM | POA: Insufficient documentation

## 2022-05-29 DIAGNOSIS — Z7984 Long term (current) use of oral hypoglycemic drugs: Secondary | ICD-10-CM | POA: Insufficient documentation

## 2022-05-29 DIAGNOSIS — E119 Type 2 diabetes mellitus without complications: Secondary | ICD-10-CM | POA: Insufficient documentation

## 2022-05-29 DIAGNOSIS — X500XXA Overexertion from strenuous movement or load, initial encounter: Secondary | ICD-10-CM | POA: Insufficient documentation

## 2022-05-29 DIAGNOSIS — I10 Essential (primary) hypertension: Secondary | ICD-10-CM | POA: Insufficient documentation

## 2022-05-29 MED ORDER — IBUPROFEN 800 MG PO TABS: 800.0000 mg | ORAL_TABLET | Freq: Three times a day (TID) | ORAL | 0 refills | Status: DC

## 2022-05-29 NOTE — Discharge Instructions (Signed)
You have been seen today for your complaint of left shoulder pain. Your imaging is reassuring, showed some mild arthritis of your clavicular and glenohumeral joint. Your discharge medications include ibuprofen 800 mg.  You should take this as prescribed for pain and to reduce inflammation. Home care instructions are as follows:  You should perform gentle range of motion exercises of the affected extremity.  You should ice the affected area 15 minutes at a time multiple times throughout the day. Follow up with: Orthopedics.  You should call the phone number listed in this packet for Dr. Zachery Dakins. Please seek immediate medical care if you develop any of the following symptoms: Your arm, hand, or fingers: Tingle. Become numb. Become swollen. Become painful. Turn white or blue. At this time there does not appear to be the presence of an emergent medical condition, however there is always the potential for conditions to change. Please read and follow the below instructions.  Do not take your medicine if  develop an itchy rash, swelling in your mouth or lips, or difficulty breathing; call 911 and seek immediate emergency medical attention if this occurs.  You may review your lab tests and imaging results in their entirety on your MyChart account.  Please discuss all results of fully with your primary care provider and other specialist at your follow-up visit.  Note: Portions of this text may have been transcribed using voice recognition software. Every effort was made to ensure accuracy; however, inadvertent computerized transcription errors may still be present.

## 2022-05-29 NOTE — ED Provider Notes (Signed)
[facility] EMERGENCY DEPARTMENT Provider Note   CSN: [id number] Arrival date & time: 05/29/22  1525     History  Chief Complaint  Patient presents with  . Shoulder Pain    Robert Pratt is a 51 y.o. male.  With history of hypertension and diabetes who presents the ED for evaluation of left shoulder pain.  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  He states 2 days ago he was pulling duct tape off an object at work.  He states that he was pulling the duct tape and it was stuck, but when it finally ripped he felt a pop in his left shoulder along with a sharp pain and intermittent burning pains.  He reports a sharp pain is located to the left glenohumeral and AC joint areas. Rates the pain 5/10 at rest and 7/10 with overhead motion. States there is some pain when he tries to sleep on the left side. Denies numbness, weakness, tingling. Reports he has been working every day since the accident.   Shoulder Pain      Home Medications Prior to Admission medications   Medication Sig Start Date End Date Taking? Authorizing Provider  amLODipine (NORVASC) 10 MG tablet Take 1 tablet (10 mg total) by mouth daily. 07/29/21   Inda Coke, PA  atorvastatin (LIPITOR) 40 MG tablet Take 1 tablet (40 mg total) by mouth daily. 07/29/21   Inda Coke, PA  famotidine (PEPCID) 20 MG tablet Take 20 mg by mouth 2 (two) times daily. 02/18/22   [provider]  gabapentin (NEURONTIN) 300 MG capsule TAKE 1 CAPSULE BY MOUTH THREE TIMES A DAY 03/15/22   Gregor Hams, MD  ibuprofen (ADVIL) 600 MG tablet Take 1 tablet (600 mg total) by mouth every 6 (six) hours as needed. 02/04/22   Domenic Moras, PA-C  lisinopril-hydrochlorothiazide (ZESTORETIC) 20-25 MG tablet TAKE 1 TABLET BY MOUTH EVERY DAY 05/17/22   Inda Coke, PA  metFORMIN (GLUCOPHAGE) 500 MG tablet Take 1 tablet (500 mg total) by mouth 2 (two) times daily with a meal. 07/29/21   Inda Coke, PA  methocarbamol (ROBAXIN)  500 MG tablet Take 2 tablets (1,000 mg total) by mouth at bedtime as needed for muscle spasms. 02/20/22   Carlisle Cater, PA-C  MOUNJARO 7.5 MG/0.5ML Pen INJECT 7.5 MG SUBCUTANEOUSLY WEEKLY 05/18/22   Inda Coke, PA  predniSONE (DELTASONE) 20 MG tablet 3 Tabs PO Days 1-3, then 2 tabs PO Days 4-6, then 1 tab PO Day 7-9, then Half Tab PO Day 10-12 Patient not taking: Reported on 03/02/2022 02/20/22   Carlisle Cater, PA-C      Allergies    No known allergies    Review of Systems   Review of Systems  Musculoskeletal:  Positive for arthralgias and myalgias.  All other systems reviewed and are negative.   Physical Exam Updated Vital Signs BP (!) 178/110 (BP Location: Right Arm)   Pulse 77   Temp 98.8 F (37.1 C) (Oral)   Resp 16   Ht '5\' 7"'$  (1.702 m)   Wt 101 kg   SpO2 95%   BMI 34.87 kg/m  Physical Exam Vitals and nursing note reviewed.  Constitutional:      General: He is not in acute distress.    Appearance: Normal appearance. He is normal weight. He is not ill-appearing.  HENT:     Head: Normocephalic and atraumatic.  Pulmonary:     Effort: Pulmonary effort is normal. No respiratory distress.  Abdominal:  General: Abdomen is flat.  Musculoskeletal:     Right shoulder: Normal.     Left shoulder: Tenderness and bony tenderness present. No swelling or deformity. Decreased range of motion. Normal strength. Normal pulse.     Cervical back: Neck supple.     Comments: Cross body, external rotation, abduction, forward flexion ranges of motion all slightly decreased compared to right side but with increased pain towards the end of movement.  Abductor strength decreased.  Skin:    General: Skin is warm and dry.  Neurological:     Mental Status: He is alert and oriented to person, place, and time.  Psychiatric:        Mood and Affect: Mood normal.        Behavior: Behavior normal.     ED Results / Procedures / Treatments   Labs (all labs ordered are listed, but only  abnormal results are displayed) Labs Reviewed - No data to display  EKG None  Radiology DG Shoulder Left  Result Date: 05/29/2022 CLINICAL DATA:  Injury, pain, lifted heavy object from floor 3 days ago EXAM: LEFT SHOULDER - 2+ VIEW COMPARISON:  None Available. FINDINGS: There is no evidence of fracture or dislocation. Minimal acromioclavicular and glenohumeral arthrosis. Soft tissues are unremarkable. IMPRESSION: No fracture or dislocation of the left shoulder. Minimal acromioclavicular and glenohumeral arthrosis. Electronically Signed   By: Delanna Ahmadi M.D.   On: 05/29/2022 15:59    Procedures Procedures    Medications Ordered in ED Medications - No data to display  ED Course/ Medical Decision Making/ A&P Clinical Course as of 05/29/22 1809  Sat May 29, 2022  1805 DG Shoulder Left I personally reviewed and interpreted the image. arthritis of the left AC and glenohumeral joints [AS]    Clinical Course User Index [AS] Kasra Melvin, Grafton Folk, PA-C                           Medical Decision Making Amount and/or Complexity of Data Reviewed Radiology: ordered.  This patient presents to the ED for concern of left shoulder pain. The differential diagnosis includes cervical radiculopathy   Co morbidities that complicate the patient evaluation  .Hypertension, diabetes  Additional history obtained from: Nursing notes from this visit. Previous records within EMR system showing most recent A1c of 8.1 and most recent random blood glucose of 151  I ordered imaging studies including x-ray of the shoulder I independently visualized and interpreted imaging which showed arthritis of the left AC and glenohumeral joints I agree with the radiologist interpretation  Afebrile.  Patient hypertensive at 178/110.  States he has not taken his blood pressure medications in the last 2 days because he forgot.  Patient was advised that he should take his blood pressure medication when he gets home, and  then continue his normal schedule starting tomorrow.  Neurovascular status of the left upper extremity intact.  Passive and active range of motion slightly decreased, likely secondary to pain.  Patient has decreased strength of left upper extremity compared to the right upper extremity, specifically with forward flexion and abduction.  Patient localizes the pain to the superior aspect of the glenoid capsule and the [facility] joint.  This correlates well with arthritic changes in the x-ray.  I believe patient suffered a muscle or ligamentous injury of the left shoulder.  I will give the patient prescription strength ibuprofen, I have instructed him to ice the extremity when at home and to perform  gentle range of motion exercises.  I have given him the phone number for orthopedics and instructed him to call for an appointment on Monday.  Patient was requesting a sling for comfort, I have ordered this but instructed patient not to use it unless he is only resting at home.  Stable at the time of discharge.  At this time there does not appear to be any evidence of an acute emergency medical condition and the patient appears stable for discharge with appropriate outpatient follow up. Diagnosis was discussed with patient who verbalizes understanding of care plan and is agreeable to discharge. I have discussed return precautions with patient who verbalizes understanding. Patient encouraged to follow-up with their PCP within 1 week. All questions answered.   Note: Portions of this report may have been transcribed using voice recognition software. Every effort was made to ensure accuracy; however, inadvertent computerized transcription errors may still be present.           Final Clinical Impression(s) / ED Diagnoses Final diagnoses:  Pain in joint of left shoulder    Rx / DC Orders ED Discharge Orders     None         Nehemiah Massed 05/29/22 1809    Fransico Meadow, MD 05/30/22  1327

## 2022-05-29 NOTE — ED Triage Notes (Signed)
Pt endorses left shoulder pain x3 days. States he was pulling something at work and then felt a burning sensation. Pt has not taken HTN meds today or yesterday.

## 2022-08-20 ENCOUNTER — Other Ambulatory Visit: Payer: Self-pay

## 2022-08-20 ENCOUNTER — Encounter (HOSPITAL_COMMUNITY): Payer: Self-pay

## 2022-08-20 ENCOUNTER — Emergency Department (HOSPITAL_COMMUNITY)
Admission: EM | Admit: 2022-08-20 | Discharge: 2022-08-20 | Disposition: A | Discharge status: Home / Self Care | Payer: Self-pay | Attending: Emergency Medicine | Admitting: Emergency Medicine

## 2022-08-20 ENCOUNTER — Emergency Department (HOSPITAL_COMMUNITY): Payer: Managed Care, Other (non HMO)

## 2022-08-20 DIAGNOSIS — M19019 Primary osteoarthritis, unspecified shoulder: Secondary | ICD-10-CM

## 2022-08-20 DIAGNOSIS — M19012 Primary osteoarthritis, left shoulder: Secondary | ICD-10-CM | POA: Diagnosis not present

## 2022-08-20 DIAGNOSIS — M25512 Pain in left shoulder: Secondary | ICD-10-CM | POA: Insufficient documentation

## 2022-08-20 DIAGNOSIS — M79642 Pain in left hand: Secondary | ICD-10-CM | POA: Insufficient documentation

## 2022-08-20 DIAGNOSIS — M19042 Primary osteoarthritis, left hand: Secondary | ICD-10-CM | POA: Diagnosis not present

## 2022-08-20 LAB — CBG MONITORING, ED: Glucose-Capillary: 109 mg/dL — ABNORMAL HIGH (ref 70–99)

## 2022-08-20 MED ORDER — IBUPROFEN 800 MG PO TABS: 800.0000 mg | ORAL_TABLET | Freq: Three times a day (TID) | ORAL | 0 refills | Status: DC

## 2022-08-20 MED ORDER — CYCLOBENZAPRINE HCL 5 MG PO TABS: 5.0000 mg | ORAL_TABLET | Freq: Three times a day (TID) | ORAL | 0 refills | Status: DC | PRN

## 2022-08-20 NOTE — ED Provider Triage Note (Signed)
Emergency Medicine Provider Triage Evaluation Note  Robert Pratt , a 51 y.o. male  was evaluated in triage.  Pt complains of ongoing left shoulder pain.  He states that he has pain when he raises his arm above shoulder height.  No neck pain.  He denies any recent injuries or falls.  Pain has been going on for about 4 months.  He also notes pins and needle sensation into his left hand as well as his right hand at times, improved with Tylenol.  Left shoulder x-ray performed 05/29/2022: IMPRESSION: No fracture or dislocation of the left shoulder. Minimal acromioclavicular and glenohumeral arthrosis.  Review of Systems  Positive: Shoulder pain, paresthesias Negative: Neck pain  Physical Exam  There were no vitals taken for this visit. Gen:   Awake, no distress   Resp:  Normal effort  MSK:   Moves extremities without difficulty  Other:  Normal left upper extremity strength of the hand, wrist, elbow.  Good strength at the shoulder but does have pain with abduction.  Medical Decision Making  Medically screening exam initiated at 1:32 PM.  Appropriate orders placed.  [name] was informed that the remainder of the evaluation will be completed by another provider, this initial triage assessment does not replace that evaluation, and the importance of remaining in the ED until their evaluation is complete.     Carlisle Cater, PA-C 08/20/22 1334

## 2022-08-20 NOTE — ED Provider Notes (Signed)
[facility] EMERGENCY DEPARTMENT Provider Note   CSN: [id number] Arrival date & time: 08/20/22  1327     History  Chief Complaint  Patient presents with   Shoulder Pain    Robert Pratt is a 51 y.o. male history of diabetes, here presenting with left shoulder pain and left hand pain.  Patient has history of arthritis and was seen here several months ago for similar symptoms.  Patient denies any trauma or injury.  Patient states that the pain has been progressively getting worse.  He has been taking Tylenol arthritis with minimal relief.  Denies any trauma or injury.  He states that he has pain when he tries to hold anything but denies any pain in his neck.  The history is provided by the patient.       Home Medications Prior to Admission medications   Medication Sig Start Date End Date Taking? Authorizing Provider  amLODipine (NORVASC) 10 MG tablet Take 1 tablet (10 mg total) by mouth daily. 07/29/21   Robert Coke, PA  atorvastatin (LIPITOR) 40 MG tablet Take 1 tablet (40 mg total) by mouth daily. 07/29/21   Robert Coke, PA  famotidine (PEPCID) 20 MG tablet Take 20 mg by mouth 2 (two) times daily. 02/18/22   [provider]  gabapentin (NEURONTIN) 300 MG capsule TAKE 1 CAPSULE BY MOUTH THREE TIMES A DAY 03/15/22   Robert Hams, MD  ibuprofen (ADVIL) 600 MG tablet Take 1 tablet (600 mg total) by mouth every 6 (six) hours as needed. 02/04/22   Robert Moras, PA-C  ibuprofen (ADVIL) 800 MG tablet Take 1 tablet (800 mg total) by mouth 3 (three) times daily. 05/29/22   Schutt, Grafton Folk, PA-C  lisinopril-hydrochlorothiazide (ZESTORETIC) 20-25 MG tablet TAKE 1 TABLET BY MOUTH EVERY DAY 05/17/22   Robert Coke, PA  metFORMIN (GLUCOPHAGE) 500 MG tablet Take 1 tablet (500 mg total) by mouth 2 (two) times daily with a meal. 07/29/21   Robert Coke, PA  methocarbamol (ROBAXIN) 500 MG tablet Take 2 tablets (1,000 mg total) by mouth at bedtime as needed for  muscle spasms. 02/20/22   Robert Cater, PA-C  MOUNJARO 7.5 MG/0.5ML Pen INJECT 7.5 MG SUBCUTANEOUSLY WEEKLY 05/18/22   Robert Coke, PA  predniSONE (DELTASONE) 20 MG tablet 3 Tabs PO Days 1-3, then 2 tabs PO Days 4-6, then 1 tab PO Day 7-9, then Half Tab PO Day 10-12 Patient not taking: Reported on 03/02/2022 02/20/22   Robert Cater, PA-C      Allergies    No known allergies    Review of Systems   Review of Systems  Musculoskeletal:        L hand and shoulder pain   All other systems reviewed and are negative.   Physical Exam Updated Vital Signs BP (!) 171/81 (BP Location: Right Arm)   Pulse 73   Temp 98.2 F (36.8 C) (Oral)   Resp 16   Ht '5\' 7"'$  (1.702 m)   Wt 98.4 kg   SpO2 98%   BMI 33.99 kg/m  Physical Exam Vitals and nursing note reviewed.  HENT:     Head: Normocephalic.     Nose: Nose normal.     Mouth/Throat:     Mouth: Mucous membranes are moist.  Eyes:     Extraocular Movements: Extraocular movements intact.     Pupils: Pupils are equal, round, and reactive to light.  Cardiovascular:     Rate and Rhythm: Normal rate and regular rhythm.  Pulses: Normal pulses.     Heart sounds: Normal heart sounds.  Pulmonary:     Effort: Pulmonary effort is normal.     Breath sounds: Normal breath sounds.  Abdominal:     General: Abdomen is flat.     Palpations: Abdomen is soft.  Musculoskeletal:     Cervical back: Normal range of motion and neck supple.     Comments: Normal range of motion of the left shoulder.  Patient has slight left hand swelling but patient has no obvious joint effusion of the wrist or the hand.  Patient is able to hand grasp.  No obvious deformity of the left wrist or hand or shoulder.  Skin:    General: Skin is warm.  Neurological:     General: No focal deficit present.     Mental Status: He is alert and oriented to person, place, and time.  Psychiatric:        Mood and Affect: Mood normal.        Behavior: Behavior normal.     ED  Results / Procedures / Treatments   Labs (all labs ordered are listed, but only abnormal results are displayed) Labs Reviewed  CBG MONITORING, ED - Abnormal; Notable for the following components:      Result Value   Glucose-Capillary 109 (*)    All other components within normal limits    EKG None  Radiology DG Shoulder Left  Result Date: 08/20/2022 CLINICAL DATA:  Left shoulder pain EXAM: LEFT SHOULDER - 2+ VIEW COMPARISON:  Shoulder radiographs dated 05/29/2022. FINDINGS: There is no evidence of fracture or dislocation. Minimal glenohumeral and acromioclavicular arthrosis is redemonstrated. Soft tissues are unremarkable. IMPRESSION: Minimal glenohumeral and acromioclavicular arthrosis. Electronically Signed   By: Robert Pratt M.D.   On: 08/20/2022 14:57    Procedures Procedures    Medications Ordered in ED Medications - No data to display  ED Course/ Medical Decision Making/ A&P                           Medical Decision Making Robert Pratt is a 51 y.o. male here presenting with left hand and left shoulder pain.  Patient's x-ray showed arthritis.  I think this is a chronic issue.  Patient had injections by Ortho previously.  Patient is diabetic so we will hold off on steroids.  I recommend NSAIDs and Flexeril as needed.  Will refer to Ortho again.   Problems Addressed: Shoulder arthritis: chronic illness or injury  Amount and/or Complexity of Data Reviewed Radiology: ordered and independent interpretation performed. Decision-making details documented in ED Course.    Final Clinical Impression(s) / ED Diagnoses Final diagnoses:  None    Rx / DC Orders ED Discharge Orders     None         Drenda Freeze, MD 08/20/22 1520

## 2022-08-20 NOTE — ED Triage Notes (Signed)
Pt arrived POV from home c/o left shoulder pain x4 months. Pt states it feels like pins in his shoulder and sometimes feels heavy.

## 2022-08-20 NOTE — Discharge Instructions (Signed)
You have arthritis in your shoulder and likely in your hand causing your pain.  I recommend taking Motrin as prescribed and taking Flexeril as needed.  Please follow-up with orthopedic doctor  Return to ER if you have worse shoulder pain and hand pain and fever

## 2022-08-26 DIAGNOSIS — M542 Cervicalgia: Secondary | ICD-10-CM | POA: Diagnosis not present

## 2022-10-19 ENCOUNTER — Encounter: Payer: BC Managed Care – PPO | Admitting: Physician Assistant

## 2022-11-01 ENCOUNTER — Encounter (HOSPITAL_COMMUNITY): Payer: Self-pay

## 2022-11-01 ENCOUNTER — Other Ambulatory Visit: Payer: Self-pay

## 2022-11-01 ENCOUNTER — Emergency Department (HOSPITAL_COMMUNITY)
Admission: EM | Admit: 2022-11-01 | Discharge: 2022-11-01 | Disposition: A | Discharge status: Home / Self Care | Payer: BC Managed Care – PPO | Attending: Emergency Medicine | Admitting: Emergency Medicine

## 2022-11-01 ENCOUNTER — Telehealth: Payer: Self-pay

## 2022-11-01 DIAGNOSIS — I10 Essential (primary) hypertension: Secondary | ICD-10-CM | POA: Insufficient documentation

## 2022-11-01 DIAGNOSIS — Z7984 Long term (current) use of oral hypoglycemic drugs: Secondary | ICD-10-CM | POA: Insufficient documentation

## 2022-11-01 DIAGNOSIS — E119 Type 2 diabetes mellitus without complications: Secondary | ICD-10-CM | POA: Insufficient documentation

## 2022-11-01 DIAGNOSIS — Z79899 Other long term (current) drug therapy: Secondary | ICD-10-CM | POA: Diagnosis not present

## 2022-11-01 DIAGNOSIS — M79602 Pain in left arm: Secondary | ICD-10-CM | POA: Insufficient documentation

## 2022-11-01 MED ORDER — CYCLOBENZAPRINE HCL 5 MG PO TABS
5.0000 mg | ORAL_TABLET | Freq: Three times a day (TID) | ORAL | 0 refills | Status: DC | PRN
Start: 2022-11-01 — End: 2023-01-13

## 2022-11-01 MED ORDER — CYCLOBENZAPRINE HCL 5 MG PO TABS
5.0000 mg | ORAL_TABLET | Freq: Three times a day (TID) | ORAL | 0 refills | Status: DC | PRN
Start: 2022-11-01 — End: 2022-11-01

## 2022-11-01 NOTE — Discharge Instructions (Signed)
Flexeril as needed as prescribed.  Follow-up with your primary care or with your orthopedic and physical therapy team.  Recommend range of motion exercises as discussed.  Return to the emergency room for chest pain, worsening or concerning symptoms.

## 2022-11-01 NOTE — Telephone Encounter (Signed)
Patient is currently at ED for left arm/shoulder pain. Pain started when he was helping his wife dress for dialysis. I will f/u with his chart and contact patient for f/u appt if needed

## 2022-11-01 NOTE — ED Provider Notes (Signed)
[facility] Provider Note   CSN: [id number] Arrival date & time: 11/01/22  7989     History  Chief Complaint  Patient presents with   Extremity Weakness    Pt walked in with c/o of right arm and shoulder pain. Hx of arthritis. States this morning he was helping his wife get dressed for dialysis when he felt severe pain to left arm and shoulder.    Robert Pratt is a 52 y.o. male.  52 year old male with past medical history of hypertension and diabetes presents with complaint of pain in his left forearm and left arm.  Patient states that he was helping his wife get ready for dialysis this morning and when he went to help pull on her socks he had difficulty due to pain in his left elbow and left shoulder.  Pain is worse with movement, reports similar exacerbations due to known arthritis in his shoulder with no new or different symptoms.  Denies loss of sensation, weakness in the arm, chest pain or shortness of breath.  Last similar exacerbation was a few weeks ago, used his sling at that time with improvement in his shoulder.  Patient was scheduled to follow-up with physical therapy however had to cancel due to caring for his wife.       Home Medications Prior to Admission medications   Medication Sig Start Date End Date Taking? Authorizing Provider  amLODipine (NORVASC) 10 MG tablet Take 1 tablet (10 mg total) by mouth daily. 07/29/21   Inda Coke, PA  atorvastatin (LIPITOR) 40 MG tablet Take 1 tablet (40 mg total) by mouth daily. 07/29/21   Inda Coke, PA  cyclobenzaprine (FLEXERIL) 5 MG tablet Take 1 tablet (5 mg total) by mouth 3 (three) times daily as needed. 11/01/22   Tacy Learn, PA-C  famotidine (PEPCID) 20 MG tablet Take 20 mg by mouth 2 (two) times daily. 02/18/22   [provider]  gabapentin (NEURONTIN) 300 MG capsule TAKE 1 CAPSULE BY MOUTH THREE TIMES A DAY 03/15/22   Gregor Hams, MD   lisinopril-hydrochlorothiazide (ZESTORETIC) 20-25 MG tablet TAKE 1 TABLET BY MOUTH EVERY DAY 05/17/22   Inda Coke, PA  metFORMIN (GLUCOPHAGE) 500 MG tablet Take 1 tablet (500 mg total) by mouth 2 (two) times daily with a meal. 07/29/21   Inda Coke, PA  methocarbamol (ROBAXIN) 500 MG tablet Take 2 tablets (1,000 mg total) by mouth at bedtime as needed for muscle spasms. 02/20/22   Carlisle Cater, PA-C  MOUNJARO 7.5 MG/0.5ML Pen INJECT 7.5 MG SUBCUTANEOUSLY WEEKLY 05/18/22   Inda Coke, PA      Allergies    No known allergies    Review of Systems   Review of Systems Negative except as per HPI Physical Exam Updated Vital Signs BP (!) 170/88 (BP Location: Right Arm)   Pulse 66   Temp 98.1 F (36.7 C) (Oral)   Resp 18   SpO2 98%  Physical Exam Vitals and nursing note reviewed.  Constitutional:      General: He is not in acute distress.    Appearance: He is well-developed. He is not diaphoretic.  HENT:     Head: Normocephalic and atraumatic.  Cardiovascular:     Pulses: Normal pulses.  Pulmonary:     Effort: Pulmonary effort is normal.  Musculoskeletal:        General: Tenderness present. No swelling, deformity or signs of injury.     Left shoulder: Tenderness present. No  crepitus. Decreased range of motion. Normal strength. Normal pulse.     Cervical back: No tenderness or bony tenderness.     Thoracic back: No tenderness or bony tenderness.     Comments: TTP left AC, palpation reproduces pain. Limited full internal and external rotation.   Skin:    General: Skin is warm and dry.     Findings: No erythema or rash.  Neurological:     Mental Status: He is alert and oriented to person, place, and time.  Psychiatric:        Behavior: Behavior normal.     ED Results / Procedures / Treatments   Labs (all labs ordered are listed, but only abnormal results are displayed) Labs Reviewed - No data to display  EKG None  Radiology No results  found.  Procedures Procedures    Medications Ordered in ED Medications - No data to display  ED Course/ Medical Decision Making/ A&P                             Medical Decision Making Risk Prescription drug management.   52 year old male presents with complaint of pain in his left shoulder and forearm onset this morning, recurrent, no new or different symptoms.  Pain is reproduced with palpation of left AC, limited full internal and external rotation of the left shoulder.  Strong radial pulse present, sensation intact, no weakness.  X-ray from 08/20/2022 reviewed showing minimal glenohumeral and AC arthrosis.  Patient reports pain generally relieved with Flexeril however he is out of this and request refill.  Provided with refill, discussed follow-up with Ortho/PT, provided with home rehab resources while awaiting follow-up with return to ER precautions discussed.        Final Clinical Impression(s) / ED Diagnoses Final diagnoses:  Left arm pain    Rx / DC Orders ED Discharge Orders          Ordered    cyclobenzaprine (FLEXERIL) 5 MG tablet  3 times daily PRN        11/01/22 0554              Tacy Learn, PA-C 22/63/33 5456    Delora Fuel, MD 25/63/89 [phone]

## 2022-11-25 ENCOUNTER — Ambulatory Visit: Payer: BC Managed Care – PPO | Admitting: Physician Assistant

## 2022-12-14 DIAGNOSIS — M25522 Pain in left elbow: Secondary | ICD-10-CM | POA: Diagnosis not present

## 2022-12-14 DIAGNOSIS — Z993 Dependence on wheelchair: Secondary | ICD-10-CM | POA: Diagnosis not present

## 2022-12-14 DIAGNOSIS — M79602 Pain in left arm: Secondary | ICD-10-CM | POA: Diagnosis not present

## 2023-01-13 ENCOUNTER — Encounter: Payer: Self-pay | Admitting: Physician Assistant

## 2023-01-13 ENCOUNTER — Ambulatory Visit: Payer: BC Managed Care – PPO | Admitting: Physician Assistant

## 2023-01-13 VITALS — BP 144/100 | HR 62 | Temp 97.3°F | Ht 67.0 in | Wt 221.0 lb

## 2023-01-13 DIAGNOSIS — M2550 Pain in unspecified joint: Secondary | ICD-10-CM | POA: Diagnosis not present

## 2023-01-13 DIAGNOSIS — I10 Essential (primary) hypertension: Secondary | ICD-10-CM | POA: Diagnosis not present

## 2023-01-13 DIAGNOSIS — E785 Hyperlipidemia, unspecified: Secondary | ICD-10-CM

## 2023-01-13 DIAGNOSIS — E119 Type 2 diabetes mellitus without complications: Secondary | ICD-10-CM

## 2023-01-13 DIAGNOSIS — Z87891 Personal history of nicotine dependence: Secondary | ICD-10-CM

## 2023-01-13 LAB — CBC WITH DIFFERENTIAL/PLATELET
Basophils Absolute: 0 10*3/uL (ref 0.0–0.1)
Basophils Relative: 0.5 % (ref 0.0–3.0)
Eosinophils Absolute: 0.3 10*3/uL (ref 0.0–0.7)
Eosinophils Relative: 6.3 % — ABNORMAL HIGH (ref 0.0–5.0)
HCT: 48.5 % (ref 39.0–52.0)
Hemoglobin: 16 g/dL (ref 13.0–17.0)
Lymphocytes Relative: 40.6 % (ref 12.0–46.0)
Lymphs Abs: 2 10*3/uL (ref 0.7–4.0)
MCHC: 32.9 g/dL (ref 30.0–36.0)
MCV: 88.4 fl (ref 78.0–100.0)
Monocytes Absolute: 0.4 10*3/uL (ref 0.1–1.0)
Monocytes Relative: 7.5 % (ref 3.0–12.0)
Neutro Abs: 2.2 10*3/uL (ref 1.4–7.7)
Neutrophils Relative %: 45.1 % (ref 43.0–77.0)
Platelets: 247 10*3/uL (ref 150.0–400.0)
RBC: 5.49 Mil/uL (ref 4.22–5.81)
RDW: 13.7 % (ref 11.5–15.5)
WBC: 4.9 10*3/uL (ref 4.0–10.5)

## 2023-01-13 LAB — LIPID PANEL
Cholesterol: 159 mg/dL (ref 0–200)
HDL: 41.1 mg/dL (ref >39.00)
LDL Cholesterol: 105 mg/dL — ABNORMAL HIGH (ref 0–99)
NonHDL: 118.09
Total CHOL/HDL Ratio: 4
Triglycerides: 63 mg/dL (ref 0.0–149.0)
VLDL: 12.6 mg/dL (ref 0.0–40.0)

## 2023-01-13 LAB — URIC ACID: Uric Acid, Serum: 5.6 mg/dL (ref 4.0–7.8)

## 2023-01-13 LAB — COMPREHENSIVE METABOLIC PANEL
ALT: 12 U/L (ref 0–53)
AST: 15 U/L (ref 0–37)
Albumin: 4.3 g/dL (ref 3.5–5.2)
Alkaline Phosphatase: 55 U/L (ref 39–117)
BUN: 14 mg/dL (ref 6–23)
CO2: 29 mEq/L (ref 19–32)
Calcium: 9.3 mg/dL (ref 8.4–10.5)
Chloride: 102 mEq/L (ref 96–112)
Creatinine, Ser: 0.89 mg/dL (ref 0.40–1.50)
GFR: 99.03 mL/min (ref >60.00)
Glucose, Bld: 128 mg/dL — ABNORMAL HIGH (ref 70–99)
Potassium: 4.5 mEq/L (ref 3.5–5.1)
Sodium: 138 mEq/L (ref 135–145)
Total Bilirubin: 0.7 mg/dL (ref 0.2–1.2)
Total Protein: 6.9 g/dL (ref 6.0–8.3)

## 2023-01-13 LAB — HEMOGLOBIN A1C: Hgb A1c MFr Bld: 6.9 % — ABNORMAL HIGH (ref 4.6–6.5)

## 2023-01-13 NOTE — Progress Notes (Signed)
Robert Pratt is a 52 y.o. male here for a follow up of a pre-existing problem.  History of Present Illness:   Chief Complaint  Patient presents with   Joint Pain    Pt c/o joint pain x 6 months, worse past month. Has been taking Reuma-Art 400 mg BID has been helping.    HPI  Joint Pain He reports diffuse joint pain x6 months with worsening over the last x1 month. He has been taking OTC Reuma-Art  BID x1 week which has helped significantly. Her tried Aleve spray, Tylenol arthritis, Ibuprofen, Advil, and an unspecified liquid supplement without any relief. He has received Toradol IM in the past without relief. H/o shoulder arthritis, lumbar stenosis, knee OA, and elbow tendonitis. He has been seen several times in the ED over the last year for various joint pains (left shoulder, left elbow, right knee, low back, and neck). He had a lumbar injection with good relief. He has never had any shoulder injections. He works at a [employer] requiring him to perform repetitive arm motions/lifting throughout his entire shift. He denies any known h/o gout. He is agreeable to seeing sports medicine again for follow up.   Hyperlipidemia He has not been taking the prescribed Lipitor  daily. Lab Results  Component Value Date   CHOL 127 08/11/2021   HDL 34.30 (L) 08/11/2021   LDLCALC 72 08/11/2021   TRIG 106.0 08/11/2021   CHOLHDL 4 08/11/2021    Hypertension He has not been taking the prescribed Zestoretic 20-25mg  daily or Norvasc  daily. He is agreeable to restart his BP medications. Denies chest pain, SOB, LE swelling. BP Readings from Last 5 Encounters:  01/13/23 (!) 144/100  11/01/22 (!) 170/88  08/20/22 (!) 177/80  05/29/22 (!) 178/110  04/22/22 121/79     DM He has not been taking the prescribed Metformin  BID. He is agreeable to resuming the Metformin. He states that the co-pay was very expensive when he was prescribed a GLP-1 agonist in  the past so he never started it. Lab Results  Component Value Date   HGBA1C 8.1 (H) 08/11/2021   Lab Results  Component Value Date   CREATININE 0.98 08/11/2021   BUN 11 08/11/2021   NA 138 08/11/2021   K 3.9 08/11/2021   CL 97 08/11/2021   CO2 31 08/11/2021        Past Medical History:  Diagnosis Date   Diabetes mellitus without complication    Hypertension      Social History   Tobacco Use   Smoking status: Former    Packs/day: 0.50    Years: 30.00    Additional pack years: 0.00    Total pack years: 15.00    Types: Cigarettes    Quit date: 10/2021    Years since quitting: 1.2   Smokeless tobacco: Never  Vaping Use   Vaping Use: Never used  Substance Use Topics   Alcohol use: No   Drug use: No    Past Surgical History:  Procedure Laterality Date   NO PAST SURGERIES      Family History  Problem Relation Age of Onset   Diabetes Mother    Hypertension Mother    Cancer Mother    Cancer Father        bone, liver   Diabetes Father    Hypertension Father    Prostate cancer Neg Hx    Colon cancer Neg Hx    Esophageal cancer Neg Hx  Rectal cancer Neg Hx     Allergies  Allergen Reactions   No Known Allergies     Current Medications:   Current Outpatient Medications:    OVER THE COUNTER MEDICATION, Take 400 mg by mouth 2 (two) times daily. Rheuma-Art joint supplementation, Disp: , Rfl:    amLODipine (NORVASC) 10 MG tablet, Take 1 tablet (10 mg total) by mouth daily. (Patient not taking: Reported on 01/13/2023), Disp: 90 tablet, Rfl: 1   atorvastatin (LIPITOR) 40 MG tablet, Take 1 tablet (40 mg total) by mouth daily. (Patient not taking: Reported on 01/13/2023), Disp: 90 tablet, Rfl: 1   famotidine (PEPCID) 20 MG tablet, Take 20 mg by mouth 2 (two) times daily. (Patient not taking: Reported on 01/13/2023), Disp: , Rfl:    lisinopril-hydrochlorothiazide (ZESTORETIC) 20-25 MG tablet, TAKE 1 TABLET BY MOUTH EVERY DAY (Patient not taking: Reported on  01/13/2023), Disp: 90 tablet, Rfl: 0   metFORMIN (GLUCOPHAGE) 500 MG tablet, Take 1 tablet (500 mg total) by mouth 2 (two) times daily with a meal. (Patient not taking: Reported on 01/13/2023), Disp: 180 tablet, Rfl: 1  Current Facility-Administered Medications:    0.9 %  sodium chloride infusion, 500 mL, Intravenous, Once, Armbruster, Willaim Rayas, MD   0.9 %  sodium chloride infusion, 500 mL, Intravenous, Once, Armbruster, Willaim Rayas, MD   Review of Systems:   Review of Systems  Constitutional:  Negative for fever and weight loss.  HENT:  Negative for hearing loss.   Eyes:  Negative for blurred vision and photophobia.  Respiratory:  Negative for cough and sputum production.   Cardiovascular:  Negative for chest pain and palpitations.  Genitourinary:  Negative for dysuria and hematuria.  Musculoskeletal:  Positive for back pain, joint pain and neck pain.  Skin:  Negative for itching and rash.  Neurological:  Negative for dizziness, sensory change and headaches.  Endo/Heme/Allergies:  Negative for environmental allergies. Does not bruise/bleed easily.  Psychiatric/Behavioral:  Negative for hallucinations and suicidal ideas.     Vitals:   Vitals:   01/13/23 0905 01/13/23 0935  BP: (!) 150/100 (!) 144/100  Pulse: 62   Temp: (!) 97.3 F (36.3 C)   TempSrc: Temporal   SpO2: 100%   Weight: 221 lb (100.2 kg)   Height:  (1.702 m)      Body mass index is 34.61 kg/m.  Physical Exam:   Physical Exam Vitals and nursing note reviewed.  Constitutional:      General: He is not in acute distress.    Appearance: Normal appearance. He is well-developed. He is not ill-appearing or toxic-appearing.  HENT:     Head: Normocephalic.     Mouth/Throat:     Pharynx: Oropharynx is clear.  Eyes:     General: No scleral icterus.    Conjunctiva/sclera: Conjunctivae normal.     Pupils: Pupils are equal, round, and reactive to light.  Neck:     Vascular: No carotid bruit.  Cardiovascular:      Rate and Rhythm: Normal rate and regular rhythm.     Pulses: Normal pulses.     Heart sounds: Normal heart sounds, S1 normal and S2 normal. No murmur heard. Pulmonary:     Effort: Pulmonary effort is normal.     Breath sounds: Normal breath sounds.  Musculoskeletal:        General: Normal range of motion.     Cervical back: Normal range of motion and neck supple. No rigidity.     Right lower leg: No  edema.     Left lower leg: No edema.  Lymphadenopathy:     Cervical: No cervical adenopathy.  Skin:    General: Skin is warm and dry.  Neurological:     Mental Status: He is alert and oriented to person, place, and time. Mental status is at baseline.     GCS: GCS eye subscore is 4. GCS verbal subscore is 5. GCS motor subscore is 6.  Psychiatric:        Speech: Speech normal.        Behavior: Behavior normal. Behavior is cooperative.        Thought Content: Thought content normal.        Judgment: Judgment normal.     Assessment and Plan:   Diabetes mellitus without complication Update A1c and likely restart Metformin Follow-up in 1 month for CPE  Primary hypertension Above goal, no evidence of end-organ damage Restart Zestoretic 20-25mg  daily or Norvasc 10mg  daily. Follow-up in 1 month, sooner if concerns  Hyperlipidemia, unspecified hyperlipidemia type Update lipid panel Restart lipitor 40 mg daily Follow-up in 1 month, sooner if concerns  Arthralgia, unspecified joint Ongoing Referral to sports medicine for further evaluation Consider daily mobic if renal function allows  History of tobacco abuse Eligible for Lung Cancer screening -- will place referral   I,Alexis Herring,acting as a scribe for Jarold Motto, PA.,have documented all relevant documentation on the behalf of Jarold Motto, PA,as directed by  Jarold Motto, PA while in the presence of Jarold Motto, [place].  I, Jarold Motto, [place], have reviewed all documentation for this visit. The documentation on  01/13/23 for the exam, diagnosis, procedures, and orders are all accurate and complete.

## 2023-01-13 NOTE — Patient Instructions (Signed)
It was great to see you!  Restart your Amlodipine 5 mg and Lisinopril-HCTZ 20-25 mg Restart your Atorvastatin 40 mg  I will be in touch with restarting metformin and adding in a medication for joint pain after I get blood work back  I have placed referral for Lung Cancer Screening and Sports Medicine  Let's follow-up in 1 month for a physical, sooner if you have concerns.  Take care,  Jarold Motto PA-C

## 2023-01-14 ENCOUNTER — Other Ambulatory Visit: Payer: Self-pay | Admitting: Physician Assistant

## 2023-01-14 MED ORDER — MELOXICAM 15 MG PO TABS: 15.0000 mg | ORAL_TABLET | Freq: Every day | ORAL | 0 refills | Status: DC

## 2023-01-14 MED ORDER — METFORMIN HCL 500 MG PO TABS: 500.0000 mg | ORAL_TABLET | Freq: Every day | ORAL | 1 refills | Status: DC

## 2023-02-10 ENCOUNTER — Encounter: Payer: BC Managed Care – PPO | Admitting: Physician Assistant

## 2023-02-22 ENCOUNTER — Other Ambulatory Visit: Payer: Self-pay

## 2023-02-22 ENCOUNTER — Emergency Department (HOSPITAL_BASED_OUTPATIENT_CLINIC_OR_DEPARTMENT_OTHER)
Admission: EM | Admit: 2023-02-22 | Discharge: 2023-02-22 | Disposition: A | Discharge status: Home / Self Care | Payer: BC Managed Care – PPO | Attending: Emergency Medicine | Admitting: Emergency Medicine

## 2023-02-22 ENCOUNTER — Encounter (HOSPITAL_BASED_OUTPATIENT_CLINIC_OR_DEPARTMENT_OTHER): Payer: Self-pay | Admitting: Emergency Medicine

## 2023-02-22 DIAGNOSIS — E669 Obesity, unspecified: Secondary | ICD-10-CM | POA: Diagnosis not present

## 2023-02-22 DIAGNOSIS — E119 Type 2 diabetes mellitus without complications: Secondary | ICD-10-CM | POA: Diagnosis not present

## 2023-02-22 DIAGNOSIS — Z1152 Encounter for screening for COVID-19: Secondary | ICD-10-CM | POA: Diagnosis not present

## 2023-02-22 DIAGNOSIS — J069 Acute upper respiratory infection, unspecified: Secondary | ICD-10-CM | POA: Diagnosis not present

## 2023-02-22 DIAGNOSIS — I10 Essential (primary) hypertension: Secondary | ICD-10-CM | POA: Insufficient documentation

## 2023-02-22 DIAGNOSIS — Z79899 Other long term (current) drug therapy: Secondary | ICD-10-CM | POA: Diagnosis not present

## 2023-02-22 DIAGNOSIS — Z7984 Long term (current) use of oral hypoglycemic drugs: Secondary | ICD-10-CM | POA: Diagnosis not present

## 2023-02-22 DIAGNOSIS — R0602 Shortness of breath: Secondary | ICD-10-CM | POA: Diagnosis not present

## 2023-02-22 LAB — RESP PANEL BY RT-PCR (RSV, FLU A&B, COVID)  RVPGX2
Influenza A by PCR: NEGATIVE
Influenza B by PCR: NEGATIVE
Resp Syncytial Virus by PCR: NEGATIVE
SARS Coronavirus 2 by RT PCR: NEGATIVE

## 2023-02-22 NOTE — Discharge Instructions (Addendum)
Your COVID and influenza testing was negative.  Recommend symptomatic management with Tylenol and ibuprofen, continue patient oral fluids, return for any severe worsening of symptoms.

## 2023-02-22 NOTE — ED Notes (Signed)
Reviewed discharge instructions and recommendations with pt. Advised to take BP medication when he gets home. Pt states understanding

## 2023-02-22 NOTE — ED Provider Notes (Signed)
[place] [facility] Provider Note   CSN: [id number] Arrival date & time: 02/22/23  1346     History  Chief Complaint  Patient presents with   Generalized Body Aches    Robert Pratt is a 52 y.o. male.  HPI   52 year old male with medical history significant for obesity, DM 2, HLD, HTN who presents to the emergency department with roughly 1 day of sore throat, nasal congestion and shortness of breath.  The patient denies any chest pain.  He denies any sick contacts.  He noticed his symptoms coming on last night.  He felt subjective fevers, denies any overt fevers, denies any chills, no abdominal pain, vomiting.  He had a brief episode of nausea at work today that resolved.  He is tolerating oral intake with no decreased oral intake. He endorses some sinus pressure and nasal congestion.  Home Medications Prior to Admission medications   Medication Sig Start Date End Date Taking? Authorizing Provider  amLODipine (NORVASC) 10 MG tablet Take 1 tablet (10 mg total) by mouth daily. Patient not taking: Reported on 01/13/2023 07/29/21   Jarold Motto, PA  atorvastatin (LIPITOR) 40 MG tablet Take 1 tablet (40 mg total) by mouth daily. Patient not taking: Reported on 01/13/2023 07/29/21   Jarold Motto, PA  famotidine (PEPCID) 20 MG tablet Take 20 mg by mouth 2 (two) times daily. Patient not taking: Reported on 01/13/2023 02/18/22   [provider]  lisinopril-hydrochlorothiazide (ZESTORETIC) 20-25 MG tablet TAKE 1 TABLET BY MOUTH EVERY DAY Patient not taking: Reported on 01/13/2023 05/17/22   Jarold Motto, PA  meloxicam (MOBIC) 15 MG tablet Take 1 tablet (15 mg total) by mouth daily. 01/14/23   Jarold Motto, PA  metFORMIN (GLUCOPHAGE) 500 MG tablet Take 1 tablet (500 mg total) by mouth daily with breakfast. 01/14/23   Jarold Motto, PA  OVER THE COUNTER MEDICATION Take 400 mg by mouth 2 (two) times daily. Rheuma-Art joint supplementation     [provider]      Allergies    No known allergies    Review of Systems   Review of Systems  All other systems reviewed and are negative.   Physical Exam Updated Vital Signs BP (!) 161/96 (BP Location: Right Arm)   Pulse 82   Temp 98.8 F (37.1 C)   Resp 20   Ht 5\' 7"  (1.702 m)   Wt 99.8 kg   SpO2 96%   BMI 34.46 kg/m  Physical Exam Vitals and nursing note reviewed.  Constitutional:      General: He is not in acute distress.    Appearance: He is well-developed.  HENT:     Head: Normocephalic and atraumatic.     Right Ear: Tympanic membrane normal.     Left Ear: Tympanic membrane normal.     Mouth/Throat:     Pharynx: Posterior oropharyngeal erythema present. No oropharyngeal exudate.  Eyes:     Conjunctiva/sclera: Conjunctivae normal.  Cardiovascular:     Rate and Rhythm: Normal rate and regular rhythm.  Pulmonary:     Effort: Pulmonary effort is normal. No respiratory distress.     Breath sounds: Normal breath sounds.  Abdominal:     Palpations: Abdomen is soft.     Tenderness: There is no abdominal tenderness.  Musculoskeletal:        General: No swelling.     Cervical back: Neck supple.  Skin:    General: Skin is warm and dry.  Capillary Refill: Capillary refill takes less than 2 seconds.  Neurological:     Mental Status: He is alert.  Psychiatric:        Mood and Affect: Mood normal.     ED Results / Procedures / Treatments   Labs (all labs ordered are listed, but only abnormal results are displayed) Labs Reviewed  RESP PANEL BY RT-PCR (RSV, FLU A&B, COVID)  [id number]    EKG None  Radiology No results found.  Procedures Procedures    Medications Ordered in ED Medications - No data to display  ED Course/ Medical Decision Making/ A&P                             Medical Decision Making   52 year old male with medical history significant for obesity, DM 2, HLD, HTN who presents to the emergency department with roughly 1  day of sore throat, nasal congestion and shortness of breath.  The patient denies any chest pain.  He denies any sick contacts.  He noticed his symptoms coming on last night.  He felt subjective fevers, denies any overt fevers, denies any chills, no abdominal pain, vomiting.  He had a brief episode of nausea at work today that resolved.  He is tolerating oral intake with no decreased oral intake. He endorses some sinus pressure and nasal congestion.   On arrival, the patient was vitally stable.  On my exam, the patient is well-appearing and well-hydrated.  The patient's lungs are clear to auscultation bilaterally. Additionally, the patient has a soft/non-tender abdomen, clear tympanic membranes, and no oropharyngeal exudates.  There are no signs of meningismus.  I see no signs of an acute bacterial infection.  The patient's presentation is most consistent with a viral upper respiratory infection.  I have a low suspicion for pneumonia as the patient's cough has been non-productive and the patient is neither tachypneic nor hypoxic on room air.  Additionally, the patient is CTAB.  COVID-19 and influenza and RSV PCR testing was collected which resulted negative.  The patient's symptoms are consistent with a viral upper respiratory infection with associated nasal congestion, shortness of breath and sore throat.  No evidence of oropharyngeal exudates, no unilateral nature to the swelling to suggest PTA, range of motion of the neck, low concern for RPA, no tongue elevation or tenderness submandibularly to suggest Ludwig's Angina.  Patient denies any chest pain or abdominal pain.  He is overall tolerating oral intake and well-appearing.  I discussed symptomatic management, including hydration, motrin, and tylenol. The patient felt safe being discharged from the ED.  They agreed to followup with the PCP if needed.  I provided ED return precautions.  Final Clinical Impression(s) / ED Diagnoses Final diagnoses:   Upper respiratory tract infection, unspecified type    Rx / DC Orders ED Discharge Orders     None         Ernie Avena, MD 02/22/23 1541

## 2023-02-22 NOTE — ED Triage Notes (Signed)
Patient arrives ambulatory by POV c/o feeling tired, short of breath and generalized body aches onset of last night. Reports dry cough and scratchy throat.

## 2023-03-25 IMAGING — DX DG CHEST 1V PORT
1 series · 1 of 1 positions shown · non-contrast
Comparison: 12/02/2012

CLINICAL DATA: Cough

EXAM:
PORTABLE CHEST 1 VIEW

[chest ap]
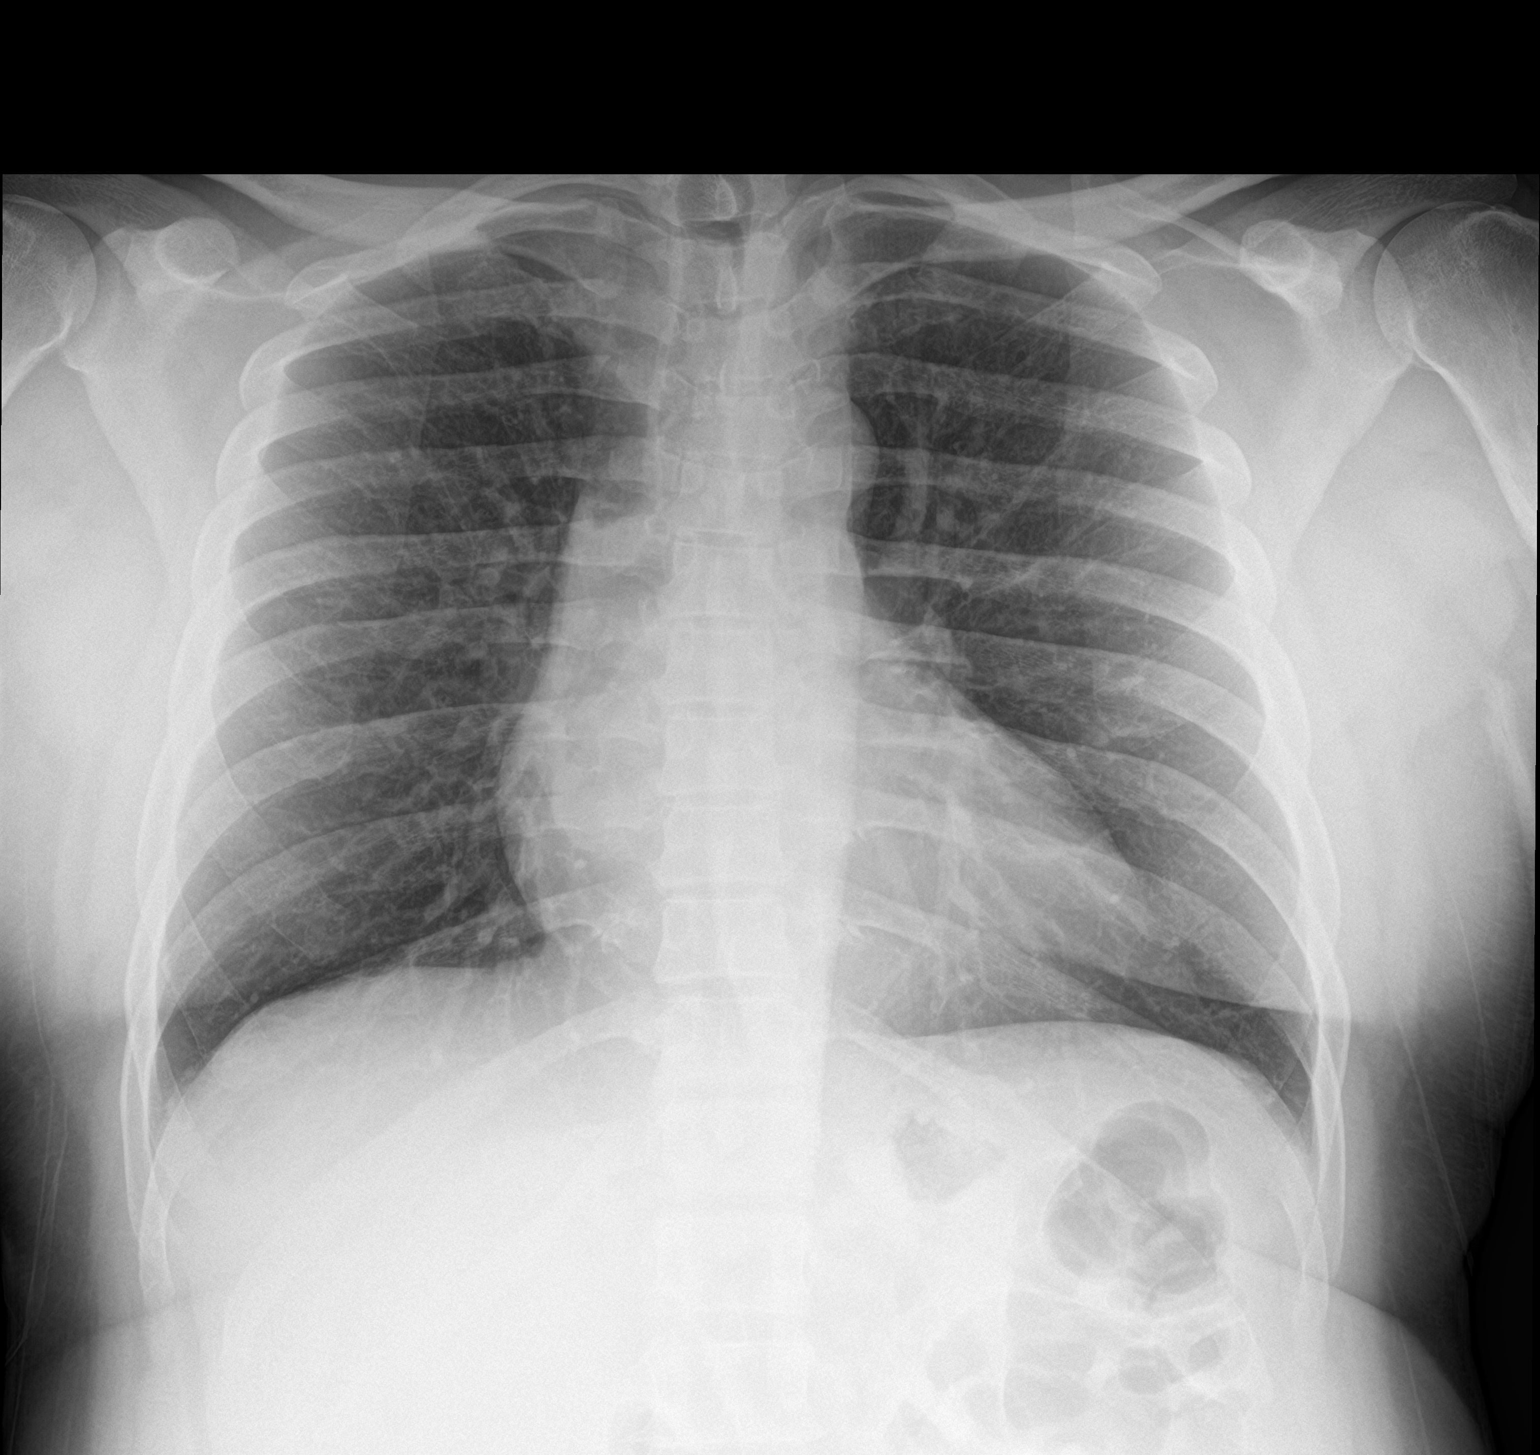

[1 of 1 positions shown; findings below may reference images not displayed]

FINDINGS: The heart size and mediastinal contours are within normal limits.
Both lungs are clear. The visualized skeletal structures are
unremarkable.
IMPRESSION: No active disease.

## 2023-05-11 ENCOUNTER — Encounter: Payer: Self-pay | Admitting: *Deleted

## 2023-11-27 IMAGING — DX DG FEMUR 2+V*R*
4 series · 4 of 4 positions shown · non-contrast
Comparison: 08/31/2013

CLINICAL DATA: Right leg pain, no known injury, initial encounter

EXAM:
RIGHT FEMUR 2 VIEWS

[t femur proximal ap right]
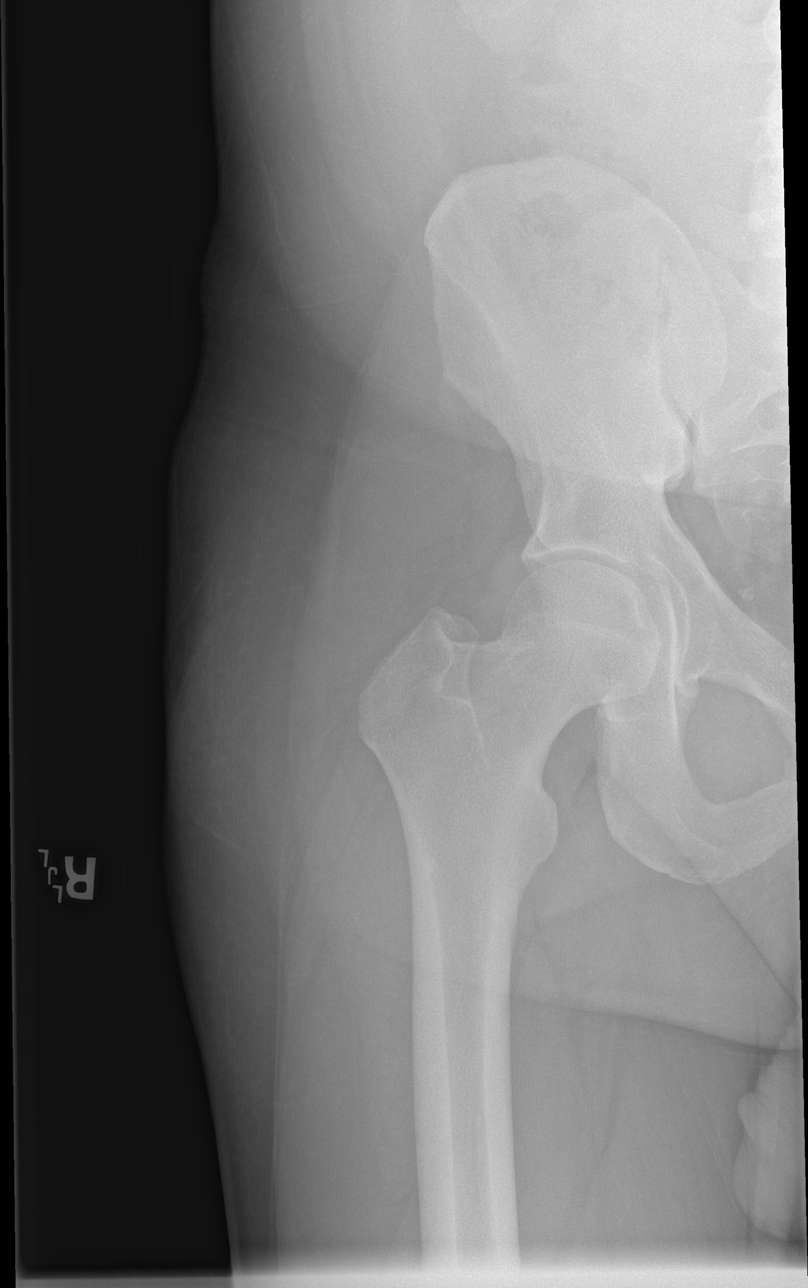

[t femur distal ap right]
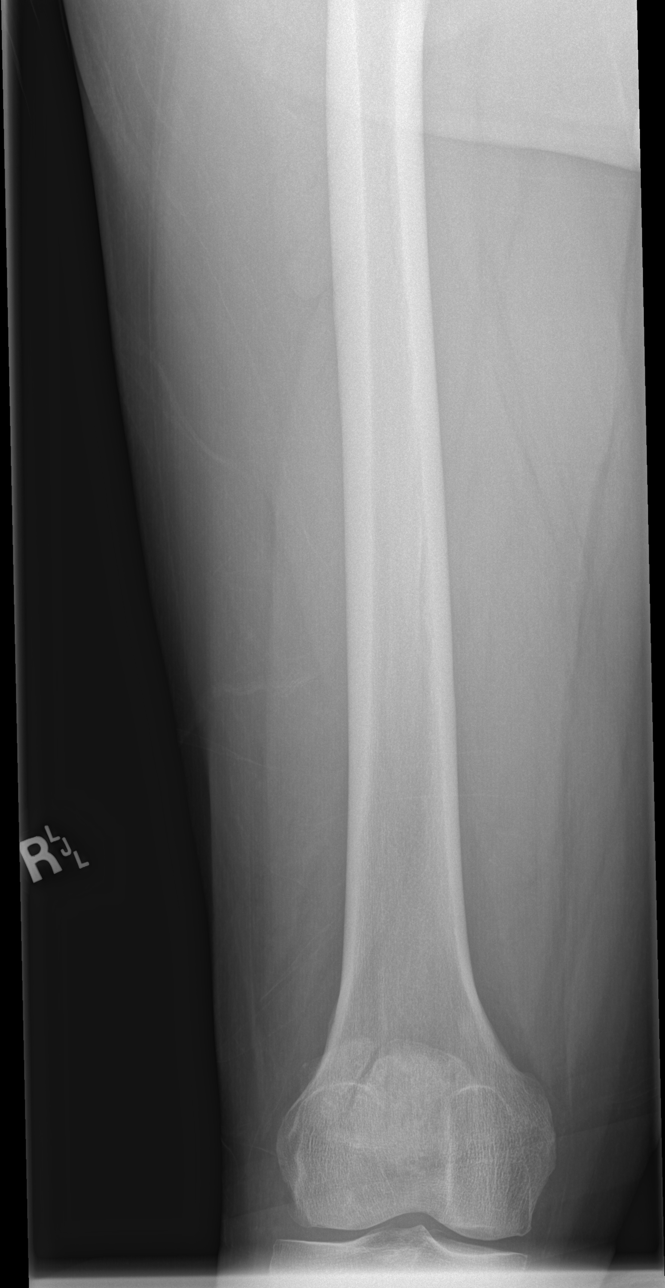

[t femur distal lat right]
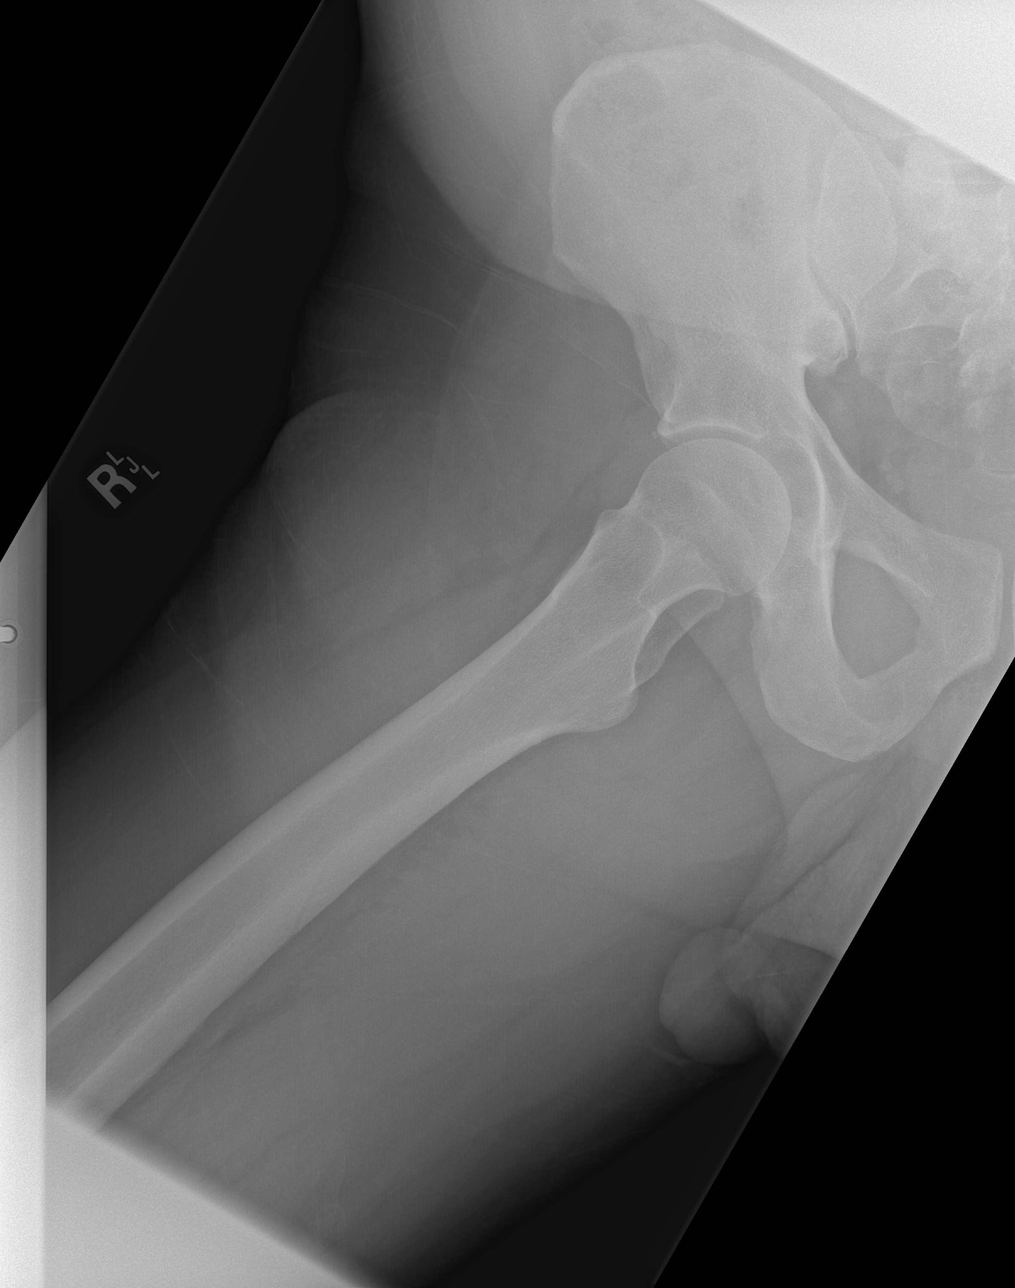

[t femur proximal lat right]
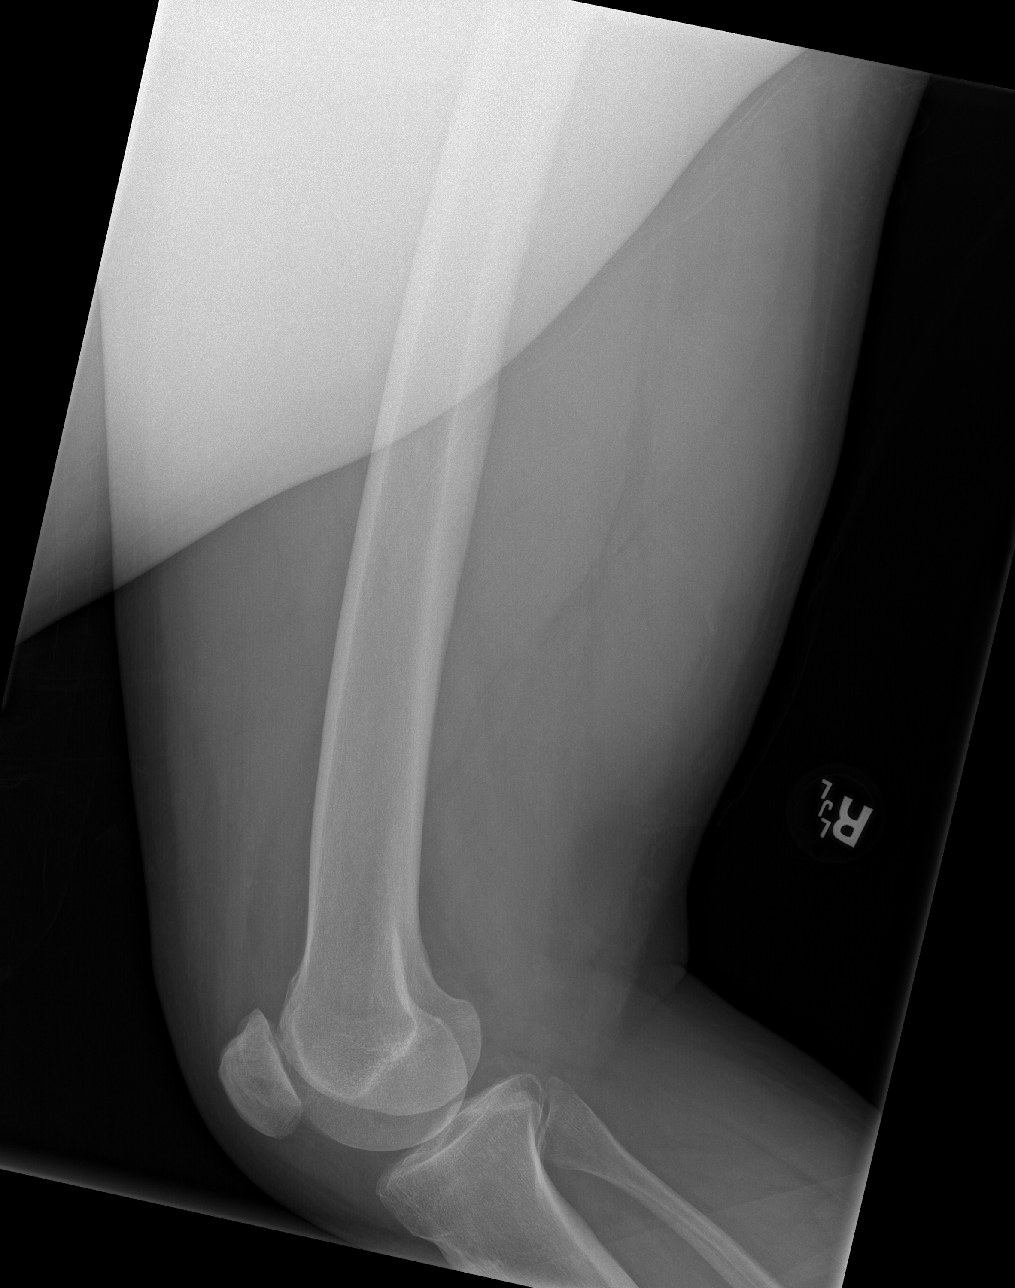

[4 of 4 positions shown; findings below may reference images not displayed]

FINDINGS: No acute fracture or dislocation is noted. Bipartite patella is
noted. No soft tissue abnormality is seen.
IMPRESSION: No acute abnormality noted.

## 2024-01-02 DIAGNOSIS — R112 Nausea with vomiting, unspecified: Secondary | ICD-10-CM | POA: Diagnosis not present

## 2024-01-02 DIAGNOSIS — R1033 Periumbilical pain: Secondary | ICD-10-CM | POA: Diagnosis not present

## 2024-01-02 DIAGNOSIS — E119 Type 2 diabetes mellitus without complications: Secondary | ICD-10-CM | POA: Diagnosis not present

## 2024-01-02 DIAGNOSIS — R1013 Epigastric pain: Secondary | ICD-10-CM | POA: Diagnosis not present

## 2024-01-02 DIAGNOSIS — R197 Diarrhea, unspecified: Secondary | ICD-10-CM | POA: Diagnosis not present

## 2024-01-02 DIAGNOSIS — I10 Essential (primary) hypertension: Secondary | ICD-10-CM | POA: Diagnosis not present

## 2024-01-02 DIAGNOSIS — K5730 Diverticulosis of large intestine without perforation or abscess without bleeding: Secondary | ICD-10-CM | POA: Diagnosis not present

## 2024-01-03 LAB — BASIC METABOLIC PANEL WITH GFR
BUN: 10 (ref 4–21)
CO2: 23 — AB (ref 13–22)
Chloride: 106 (ref 99–108)
Creatinine: 0.9 (ref 0.6–1.3)
Glucose: 144
Potassium: 3.9 meq/L (ref 3.5–5.1)
Sodium: 140 (ref 137–147)

## 2024-01-03 LAB — COMPREHENSIVE METABOLIC PANEL WITH GFR
Albumin: 4 (ref 3.5–5.0)
Calcium: 8.8 (ref 8.7–10.7)
Globulin: 3
eGFR: 103

## 2024-01-03 LAB — CBC AND DIFFERENTIAL
HCT: 48 (ref 41–53)
Hemoglobin: 15 (ref 13.5–17.5)
Neutrophils Absolute: 3.97
WBC: 7.1

## 2024-01-03 LAB — HEPATIC FUNCTION PANEL
ALT: 16 U/L (ref 10–40)
AST: 18 (ref 14–40)
Alkaline Phosphatase: 68 (ref 25–125)
Bilirubin, Total: 0.2

## 2024-01-03 LAB — CBC: RBC: 5.26 — AB (ref 3.87–5.11)

## 2024-01-06 ENCOUNTER — Encounter: Payer: Self-pay | Admitting: Physician Assistant

## 2024-01-06 LAB — LIPASE: Lipase: 22

## 2024-01-25 ENCOUNTER — Encounter: Admitting: Physician Assistant

## 2024-03-09 DIAGNOSIS — M19042 Primary osteoarthritis, left hand: Secondary | ICD-10-CM | POA: Diagnosis not present

## 2024-03-09 DIAGNOSIS — M79645 Pain in left finger(s): Secondary | ICD-10-CM | POA: Diagnosis not present

## 2024-03-09 DIAGNOSIS — M79642 Pain in left hand: Secondary | ICD-10-CM | POA: Diagnosis not present

## 2024-03-09 DIAGNOSIS — Z79899 Other long term (current) drug therapy: Secondary | ICD-10-CM | POA: Diagnosis not present

## 2024-03-09 DIAGNOSIS — I10 Essential (primary) hypertension: Secondary | ICD-10-CM | POA: Diagnosis not present

## 2024-03-09 DIAGNOSIS — M1990 Unspecified osteoarthritis, unspecified site: Secondary | ICD-10-CM | POA: Diagnosis not present

## 2024-03-09 DIAGNOSIS — F17210 Nicotine dependence, cigarettes, uncomplicated: Secondary | ICD-10-CM | POA: Diagnosis not present

## 2024-06-05 ENCOUNTER — Emergency Department (HOSPITAL_COMMUNITY)

## 2024-06-05 ENCOUNTER — Emergency Department (HOSPITAL_COMMUNITY)
Admission: EM | Admit: 2024-06-05 | Discharge: 2024-06-06 | Disposition: A | Discharge status: Home / Self Care | Attending: Emergency Medicine | Admitting: Emergency Medicine

## 2024-06-05 ENCOUNTER — Encounter (HOSPITAL_COMMUNITY): Payer: Self-pay

## 2024-06-05 ENCOUNTER — Other Ambulatory Visit: Payer: Self-pay

## 2024-06-05 DIAGNOSIS — R0602 Shortness of breath: Secondary | ICD-10-CM | POA: Diagnosis not present

## 2024-06-05 DIAGNOSIS — R079 Chest pain, unspecified: Secondary | ICD-10-CM | POA: Diagnosis not present

## 2024-06-05 DIAGNOSIS — R432 Parageusia: Secondary | ICD-10-CM | POA: Diagnosis not present

## 2024-06-05 DIAGNOSIS — M791 Myalgia, unspecified site: Secondary | ICD-10-CM | POA: Insufficient documentation

## 2024-06-05 DIAGNOSIS — Z87891 Personal history of nicotine dependence: Secondary | ICD-10-CM | POA: Diagnosis not present

## 2024-06-05 DIAGNOSIS — R439 Unspecified disturbances of smell and taste: Secondary | ICD-10-CM | POA: Diagnosis not present

## 2024-06-05 DIAGNOSIS — R0600 Dyspnea, unspecified: Secondary | ICD-10-CM | POA: Diagnosis not present

## 2024-06-05 DIAGNOSIS — I10 Essential (primary) hypertension: Secondary | ICD-10-CM | POA: Insufficient documentation

## 2024-06-05 DIAGNOSIS — Z7984 Long term (current) use of oral hypoglycemic drugs: Secondary | ICD-10-CM | POA: Diagnosis not present

## 2024-06-05 DIAGNOSIS — E119 Type 2 diabetes mellitus without complications: Secondary | ICD-10-CM | POA: Insufficient documentation

## 2024-06-05 DIAGNOSIS — Z79899 Other long term (current) drug therapy: Secondary | ICD-10-CM | POA: Insufficient documentation

## 2024-06-05 DIAGNOSIS — R52 Pain, unspecified: Secondary | ICD-10-CM

## 2024-06-05 LAB — CBC WITH DIFFERENTIAL/PLATELET
Abs Immature Granulocytes: 0.04 K/uL (ref 0.00–0.07)
Basophils Absolute: 0 K/uL (ref 0.0–0.1)
Basophils Relative: 1 %
Eosinophils Absolute: 0.2 K/uL (ref 0.0–0.5)
Eosinophils Relative: 3 %
HCT: 48.7 % (ref 39.0–52.0)
Hemoglobin: 15.3 g/dL (ref 13.0–17.0)
Immature Granulocytes: 1 %
Lymphocytes Relative: 30 %
Lymphs Abs: 2 K/uL (ref 0.7–4.0)
MCH: 29 pg (ref 26.0–34.0)
MCHC: 31.4 g/dL (ref 30.0–36.0)
MCV: 92.2 fL (ref 80.0–100.0)
Monocytes Absolute: 0.3 K/uL (ref 0.1–1.0)
Monocytes Relative: 5 %
Neutro Abs: 3.9 K/uL (ref 1.7–7.7)
Neutrophils Relative %: 60 %
Platelets: 283 K/uL (ref 150–400)
RBC: 5.28 MIL/uL (ref 4.22–5.81)
RDW: 12.4 % (ref 11.5–15.5)
WBC: 6.5 K/uL (ref 4.0–10.5)
nRBC: 0 % (ref 0.0–0.2)

## 2024-06-05 LAB — COMPREHENSIVE METABOLIC PANEL WITH GFR
ALT: 16 U/L (ref 0–44)
AST: 26 U/L (ref 15–41)
Albumin: 3.7 g/dL (ref 3.5–5.0)
Alkaline Phosphatase: 50 U/L (ref 38–126)
Anion gap: 10 (ref 5–15)
BUN: 13 mg/dL (ref 6–20)
CO2: 21 mmol/L — ABNORMAL LOW (ref 22–32)
Calcium: 9 mg/dL (ref 8.9–10.3)
Chloride: 106 mmol/L (ref 98–111)
Creatinine, Ser: 0.98 mg/dL (ref 0.61–1.24)
GFR, Estimated: >60 mL/min (ref >60)
Glucose, Bld: 164 mg/dL — ABNORMAL HIGH (ref 70–99)
Potassium: 4.1 mmol/L (ref 3.5–5.1)
Sodium: 137 mmol/L (ref 135–145)
Total Bilirubin: 0.8 mg/dL (ref 0.0–1.2)
Total Protein: 6.5 g/dL (ref 6.5–8.1)

## 2024-06-05 LAB — SARS CORONAVIRUS 2 BY RT PCR: SARS Coronavirus 2 by RT PCR: NEGATIVE

## 2024-06-05 NOTE — ED Triage Notes (Addendum)
 Pt c/o shortness of breath, body aching and not being able to taste anything. Pt states he wasn't able to taste last night and then had shortness of breath this am. Pt denies cough, fever, chills, nausea or vomiting. Pt has not had HTN meds in 4 days.

## 2024-06-05 NOTE — ED Provider Triage Note (Signed)
 Emergency Medicine Provider Triage Evaluation Note  Robert Pratt , a 53 y.o. male  was evaluated in triage.  Pt complains of shortness of breath of sudden onset which began last night.  Loss in taste, loss and smell.  Also feeling body aches throughout.  Has not taken anything for improvement in symptoms.  Review of Systems  Positive: Shortness of breath, cough Negative: Fever  Physical Exam  BP (!) 201/101 (BP Location: Right Arm)   Pulse 88   Temp 98.4 F (36.9 C)   Resp 18   Ht 5' 7 (1.702 m)   Wt 102.1 kg   SpO2 94%   BMI 35.24 kg/m  Gen:   Awake, no distress   Resp:  Normal effort  MSK:   Moves extremities without difficulty  Other:    Medical Decision Making  Medically screening exam initiated at 2:26 PM.  Appropriate orders placed.  Robert Pratt was informed that the remainder of the evaluation will be completed by another provider, this initial triage assessment does not replace that evaluation, and the importance of remaining in the ED until their evaluation is complete.     Robert Olaes, PA-C 06/05/24 1429

## 2024-06-06 NOTE — ED Provider Notes (Signed)
 [place] [facility] AT [facility] Provider Note   CSN: [id number] Arrival date & time: 06/05/24  1408     Patient presents with: Shortness of Breath   Robert Pratt is a 53 y.o. male.  Patient presents the emergency room complaining of feeling winded with exertion, body aches, loss of taste for the past 24 hours.  He denies chest pain, cough, fever, nausea, vomiting.  He was hypertensive upon arrival but states he has not taken his blood pressure medication for a few days.    Shortness of Breath      Prior to Admission medications   Medication Sig Start Date End Date Taking? Authorizing Provider  amLODipine  (NORVASC ) 10 MG tablet Take 1 tablet (10 mg total) by mouth daily. Patient not taking: Reported on 01/13/2023 07/29/21   Job Lukes, PA  atorvastatin  (LIPITOR) 40 MG tablet Take 1 tablet (40 mg total) by mouth daily. Patient not taking: Reported on 01/13/2023 07/29/21   Job Lukes, PA  famotidine  (PEPCID ) 20 MG tablet Take 20 mg by mouth 2 (two) times daily. Patient not taking: Reported on 01/13/2023 02/18/22   [provider]  lisinopril -hydrochlorothiazide  (ZESTORETIC ) 20-25 MG tablet TAKE 1 TABLET BY MOUTH EVERY DAY Patient not taking: Reported on 01/13/2023 05/17/22   Job Lukes, PA  meloxicam  (MOBIC ) 15 MG tablet Take 1 tablet (15 mg total) by mouth daily. 01/14/23   Job Lukes, PA  metFORMIN  (GLUCOPHAGE ) 500 MG tablet Take 1 tablet (500 mg total) by mouth daily with breakfast. 01/14/23   Job Lukes, PA  OVER THE COUNTER MEDICATION Take 400 mg by mouth 2 (two) times daily. Rheuma-Art joint supplementation    [provider]    Allergies: No known allergies    Review of Systems  Respiratory:  Positive for shortness of breath.     Updated Vital Signs BP (!) 168/95 (BP Location: Left Arm)   Pulse (!) 58   Temp 98 F (36.7 C) (Oral)   Resp 20   Ht 5' 7 (1.702 m)   Wt 102.1 kg   SpO2 100%   BMI 35.24  kg/m   Physical Exam Vitals and nursing note reviewed.  Constitutional:      General: He is not in acute distress.    Appearance: He is well-developed.  HENT:     Head: Normocephalic and atraumatic.  Eyes:     Conjunctiva/sclera: Conjunctivae normal.  Cardiovascular:     Rate and Rhythm: Normal rate and regular rhythm.     Heart sounds: No murmur heard. Pulmonary:     Effort: Pulmonary effort is normal. No respiratory distress.     Breath sounds: Normal breath sounds.  Abdominal:     Palpations: Abdomen is soft.     Tenderness: There is no abdominal tenderness.  Musculoskeletal:        General: No swelling.     Cervical back: Neck supple.     Right lower leg: No edema.     Left lower leg: No edema.  Skin:    General: Skin is warm and dry.     Capillary Refill: Capillary refill takes less than 2 seconds.  Neurological:     Mental Status: He is alert.  Psychiatric:        Mood and Affect: Mood normal.     (all labs ordered are listed, but only abnormal results are displayed) Labs Reviewed  COMPREHENSIVE METABOLIC PANEL WITH GFR - Abnormal; Notable for the following components:  Result Value   CO2 21 (*)    Glucose, Bld 164 (*)    All other components within normal limits  SARS CORONAVIRUS 2 BY RT PCR  CBC WITH DIFFERENTIAL/PLATELET    EKG: EKG Interpretation Date/Time:  Tuesday June 05 2024 14:16:43 EDT Ventricular Rate:  84 PR Interval:  176 QRS Duration:  78 QT Interval:  370 QTC Calculation: 437 R Axis:   76  Text Interpretation: Normal sinus rhythm Septal infarct , age undetermined ST & T wave abnormality, consider inferolateral ischemia Abnormal ECG When compared with ECG of 11-Jun-2021 20:49, T wave inversion is less prominent Confirmed by Raford Lenis ([id number]) on 06/06/2024 12:02:14 AM  Radiology: DG Chest 1 View Result Date: 06/05/2024 EXAM: 1 VIEW XRAY OF THE CHEST 06/05/2024 03:14:48 PM COMPARISON: 06/23/2021 CLINICAL HISTORY: Pt c/o  shortness of breath, body aching and not being able to taste anything. Pt states he wasn't able to taste last night and then had shortness of breath this am. Pt denies cough, fever, chills, nausea or vomiting. Pt has not had HTN meds in 4 days. FINDINGS: LUNGS AND PLEURA: No focal pulmonary opacity. No pulmonary edema. No pleural effusion. No pneumothorax. HEART AND MEDIASTINUM: No acute abnormality of the cardiac and mediastinal silhouettes. BONES AND SOFT TISSUES: No acute osseous abnormality. IMPRESSION: 1. No acute process. Electronically signed by: Waddell Calk MD 06/05/2024 03:51 PM EDT RP Workstation: [id number]     Procedures   Medications Ordered in the ED - No data to display                                  Medical Decision Making  This patient presents to the ED for concern of bodyaches and loss of taste, this involves an extensive number of treatment options, and is a complaint that carries with it a high risk of complications and morbidity.  The differential diagnosis includes COVID, other viral infection, pneumonia, others   Co morbidities / Chronic conditions that complicate the patient evaluation  Hypertension, type II DM   Additional history obtained:  Additional history obtained from EMR   Lab Tests:  I Ordered, and personally interpreted labs.  The pertinent results include: Grossly unremarkable CMP, CBC, negative COVID test   Imaging Studies ordered:  I ordered imaging studies including chest x-ray I independently visualized and interpreted imaging which showed no acute findings I agree with the radiologist interpretation   Social Determinants of Health:  Patient is a former smoker   Test / Admission - Considered:  Patient assessed by me in the triage area after he requested to go home.  He told the sort nurse in the lobby that he was ready to go after seeing his results on MyChart.  The patient denies chest pain.  He does endorse some shortness of  breath with exertion which is new but also endorses body aches and a change in taste.  This sounds like a viral illness.  Consider possible COVID, potentially early for detection.  His workup has been unremarkable.  His oxygenation is 100%.  His patient is wishing to discharge home this seems reasonable.  I do not see an indication currently for admission.  Strict return precautions provided.  Work note provided.      Final diagnoses:  Dyspnea, unspecified type  Body aches  Dysgeusia    ED Discharge Orders     None  Logan Ubaldo NOVAK, PA-C 06/06/24 0242    Midge Golas, MD 06/06/24 [phone]

## 2024-06-06 NOTE — Discharge Instructions (Signed)
 Your workup this evening was reassuring overall.  If your shortness of breath worsens or you develop any other severe acute symptoms please return to the emergency department.  Otherwise please follow-up with your primary care provider for further evaluation.

## 2024-07-10 ENCOUNTER — Ambulatory Visit (INDEPENDENT_AMBULATORY_CARE_PROVIDER_SITE_OTHER): Admitting: Physician Assistant

## 2024-07-10 ENCOUNTER — Encounter: Payer: Self-pay | Admitting: Physician Assistant

## 2024-07-10 ENCOUNTER — Ambulatory Visit: Payer: Self-pay | Admitting: Physician Assistant

## 2024-07-10 VITALS — BP 134/88 | HR 88 | Ht 67.0 in | Wt 230.0 lb

## 2024-07-10 DIAGNOSIS — Z0000 Encounter for general adult medical examination without abnormal findings: Secondary | ICD-10-CM

## 2024-07-10 DIAGNOSIS — E119 Type 2 diabetes mellitus without complications: Secondary | ICD-10-CM

## 2024-07-10 DIAGNOSIS — I10 Essential (primary) hypertension: Secondary | ICD-10-CM

## 2024-07-10 DIAGNOSIS — R051 Acute cough: Secondary | ICD-10-CM

## 2024-07-10 DIAGNOSIS — Z720 Tobacco use: Secondary | ICD-10-CM

## 2024-07-10 DIAGNOSIS — E785 Hyperlipidemia, unspecified: Secondary | ICD-10-CM

## 2024-07-10 LAB — POC COVID19 BINAXNOW: SARS Coronavirus 2 Ag: NEGATIVE

## 2024-07-10 LAB — HEMOGLOBIN A1C: Hgb A1c MFr Bld: 8.3 % — ABNORMAL HIGH (ref 4.6–6.5)

## 2024-07-10 LAB — CBC WITH DIFFERENTIAL/PLATELET
Basophils Absolute: 0 K/uL (ref 0.0–0.1)
Basophils Relative: 0.3 % (ref 0.0–3.0)
Eosinophils Absolute: 0.1 K/uL (ref 0.0–0.7)
Eosinophils Relative: 0.9 % (ref 0.0–5.0)
HCT: 53 % — ABNORMAL HIGH (ref 39.0–52.0)
Hemoglobin: 17 g/dL (ref 13.0–17.0)
Lymphocytes Relative: 13.9 % (ref 12.0–46.0)
Lymphs Abs: 1.2 K/uL (ref 0.7–4.0)
MCHC: 32 g/dL (ref 30.0–36.0)
MCV: 88.8 fl (ref 78.0–100.0)
Monocytes Absolute: 0.4 K/uL (ref 0.1–1.0)
Monocytes Relative: 4.5 % (ref 3.0–12.0)
Neutro Abs: 6.8 K/uL (ref 1.4–7.7)
Neutrophils Relative %: 80.4 % — ABNORMAL HIGH (ref 43.0–77.0)
Platelets: 278 K/uL (ref 150.0–400.0)
RBC: 5.96 Mil/uL — ABNORMAL HIGH (ref 4.22–5.81)
RDW: 13.2 % (ref 11.5–15.5)
WBC: 8.5 K/uL (ref 4.0–10.5)

## 2024-07-10 LAB — LIPID PANEL
Cholesterol: 188 mg/dL (ref 0–200)
HDL: 49.9 mg/dL (ref >39.00)
LDL Cholesterol: 114 mg/dL — ABNORMAL HIGH (ref 0–99)
NonHDL: 137.82
Total CHOL/HDL Ratio: 4
Triglycerides: 117 mg/dL (ref 0.0–149.0)
VLDL: 23.4 mg/dL (ref 0.0–40.0)

## 2024-07-10 LAB — COMPREHENSIVE METABOLIC PANEL WITH GFR
ALT: 16 U/L (ref 0–53)
AST: 15 U/L (ref 0–37)
Albumin: 4.5 g/dL (ref 3.5–5.2)
Alkaline Phosphatase: 61 U/L (ref 39–117)
BUN: 16 mg/dL (ref 6–23)
CO2: 32 meq/L (ref 19–32)
Calcium: 10 mg/dL (ref 8.4–10.5)
Chloride: 96 meq/L (ref 96–112)
Creatinine, Ser: 0.94 mg/dL (ref 0.40–1.50)
GFR: 92.71 mL/min (ref >60.00)
Glucose, Bld: 215 mg/dL — ABNORMAL HIGH (ref 70–99)
Potassium: 4 meq/L (ref 3.5–5.1)
Sodium: 136 meq/L (ref 135–145)
Total Bilirubin: 0.5 mg/dL (ref 0.2–1.2)
Total Protein: 8 g/dL (ref 6.0–8.3)

## 2024-07-10 LAB — POC INFLUENZA A&B (BINAX/QUICKVUE)
Influenza A, POC: NEGATIVE
Influenza B, POC: NEGATIVE

## 2024-07-10 MED ORDER — METFORMIN HCL ER 750 MG PO TB24: ORAL_TABLET | ORAL | 1 refills | Status: AC

## 2024-07-10 MED ORDER — ATORVASTATIN CALCIUM 40 MG PO TABS: 40.0000 mg | ORAL_TABLET | Freq: Every day | ORAL | 3 refills | Status: AC

## 2024-07-10 NOTE — Patient Instructions (Signed)
 It was great to see you!  I will refill medications based on blood work results  Follow up in 4 months  Take care,  Landy Mace PA-C

## 2024-07-10 NOTE — Progress Notes (Signed)
 Subjective:    Robert Pratt is a 53 y.o. male and is here for a comprehensive physical exam.  HPI  Health Maintenance Due  Topic Date Due   FOOT EXAM  Never done   Hepatitis C Screening  Never done   Hepatitis B Vaccines 19-59 Average Risk (1 of 3 - 19+ 3-dose series) Never done   Diabetic kidney evaluation - Urine ACR  04/01/2018   OPHTHALMOLOGY EXAM  10/16/2020   HEMOGLOBIN A1C  07/15/2023   Discussed the use of AI scribe software for clinical note transcription with the patient, who gave verbal consent to proceed.  History of Present Illness   Robert Pratt is a 53 year old male with hypertension and diabetes who presents for Comprehensive Physical Exam (CPE) preventive care annual visit.  He experiences numbness and tingling in his arm and fingers. He was recently seen at Urgent Care on 07/05/24 and was prescribed prednisone  and cyclobenzaprine . He uses a hand exercise device at home, which he finds beneficial.  He has hypertension and diabetes. He recently refilled amlodipine  and lisinopril  hydrochloroTHIAZIDE  at Urgent Care but was without them for about a month. He is not taking metformin  or cholesterol medication as he needs refills. He has gained approximately 10 pounds since May.  He started smoking in June or July and denies alcohol use. He works from 3 PM to 1 AM and tries to walk at least 2,000 steps daily, often reaching 5,000 to 7,000 steps. His sleep is described as 'iffy'.  No issues with urination or changes in bowel habits. Recent symptoms include coughing, congestion, and body aches, for which he takes Theraflu and cough drops. No fever or ear pain.       Health Maintenance: Immunizations -- declined vaccines Colonoscopy -- UpToDate  PSA --  Lab Results  Component Value Date   PSA 0.9 04/15/2017   Diet -- variable Sleep habits -- shift work -- can have insomnia at times Exercise -- walking a lot at work --- gets at least 2000 steps daily  Weight  -- Weight: 230 lb (104.3 kg)  Recent weight history Wt Readings from Last 10 Encounters:  07/10/24 230 lb (104.3 kg)  06/05/24 225 lb (102.1 kg)  02/22/23 220 lb (99.8 kg)  01/13/23 221 lb (100.2 kg)  08/20/22 217 lb (98.4 kg)  05/29/22 222 lb 10.6 oz (101 kg)  04/22/22 223 lb (101.2 kg)  03/02/22 223 lb (101.2 kg)  03/02/22 226 lb 6.1 oz (102.7 kg)  02/18/22 225 lb (102.1 kg)   Body mass index is 36.02 kg/m.  Mood -- overall stable Alcohol use --  reports no history of alcohol use.  Tobacco use --  Tobacco Use: High Risk (07/10/2024)   Patient History    Smoking Tobacco Use: Every Day    Smokeless Tobacco Use: Never    Passive Exposure: Not on file    Eligible for Low Dose CT? no  UTD with eye doctor? no UTD with dentist? no     07/10/2024   10:42 AM  Depression screen PHQ 2/9  Decreased Interest 0  Down, Depressed, Hopeless 0  PHQ - 2 Score 0  Altered sleeping 0  Tired, decreased energy 0  Change in appetite 0  Feeling bad or failure about yourself  0  Trouble concentrating 0  Moving slowly or fidgety/restless 0  Suicidal thoughts 0  PHQ-9 Score 0  Difficult doing work/chores Not difficult at all    Other providers/specialists: Patient Care Team:  Job Lukes, PA as PCP - General (Physician Assistant)    PMHx, SurgHx, SocialHx, Medications, and Allergies were reviewed in the Visit Navigator and updated as appropriate.   Past Medical History:  Diagnosis Date   Arthritis    Diabetes mellitus without complication (HCC)    Hypertension      Past Surgical History:  Procedure Laterality Date   NO PAST SURGERIES       Family History  Problem Relation Age of Onset   Diabetes Mother    Hypertension Mother    Breast cancer Mother    Arthritis Mother    Cancer Father        bone, liver   Diabetes Father    Hypertension Father    Prostate cancer Neg Hx    Colon cancer Neg Hx    Esophageal cancer Neg Hx    Rectal cancer Neg Hx     Social  History   Tobacco Use   Smoking status: Every Day    Current packs/day: 0.00    Average packs/day: 0.5 packs/day for 30.0 years (15.0 ttl pk-yrs)    Types: Cigarettes    Start date: 10/1991    Last attempt to quit: 10/2021    Years since quitting: 2.7   Smokeless tobacco: Never  Vaping Use   Vaping status: Never Used  Substance Use Topics   Alcohol use: No   Drug use: No    Review of Systems:   Review of Systems  Constitutional:  Negative for chills, fever, malaise/fatigue and weight loss.  HENT:  Positive for congestion. Negative for hearing loss, sinus pain and sore throat.   Respiratory:  Positive for cough. Negative for hemoptysis.   Cardiovascular:  Negative for chest pain, palpitations, leg swelling and PND.  Gastrointestinal:  Negative for abdominal pain, constipation, diarrhea, heartburn, nausea and vomiting.  Genitourinary:  Negative for dysuria, frequency and urgency.  Musculoskeletal:  Negative for back pain, myalgias and neck pain.  Skin:  Negative for itching and rash.  Neurological:  Negative for dizziness, tingling, seizures and headaches.  Endo/Heme/Allergies:  Negative for polydipsia.  Psychiatric/Behavioral:  Negative for depression. The patient is not nervous/anxious.     Objective:    Vitals:   07/10/24 0954 07/10/24 1109  BP: (!) 144/100 134/88  Pulse: 88   SpO2: 96%     Body mass index is 36.02 kg/m.  General  Alert, cooperative, no distress, appears stated age  Head:  Normocephalic, without obvious abnormality, atraumatic  Eyes:  PERRL, conjunctiva/corneas clear, EOM's intact, fundi benign, both eyes       Ears:  Normal TM's and external ear canals, both ears  Nose: Nares normal, septum midline, mucosa normal, no drainage or sinus tenderness  Throat: Lips, mucosa, and tongue normal; teeth and gums normal  Neck: Supple, symmetrical, trachea midline, no adenopathy;     thyroid:  No enlargement/tenderness/nodules; no carotid bruit or JVD   Back:   Symmetric, no curvature, ROM normal, no CVA tenderness  Lungs:   Clear to auscultation bilaterally, respirations unlabored  Chest wall:  No tenderness or deformity  Heart:  Regular rate and rhythm, S1 and S2 normal, no murmur, rub or gallop  Abdomen:   Soft, non-tender, bowel sounds active all four quadrants, no masses, no organomegaly  Extremities: Extremities normal, atraumatic, no cyanosis or edema  Prostate : Deferred   Skin: Skin color, texture, turgor normal, no rashes or lesions  Lymph nodes: Cervical, supraclavicular, and axillary nodes normal  Neurologic: CNII-XII grossly  intact. Normal strength, sensation and reflexes throughout   AssessmentPlan:   Assessment and Plan    Comprehensive Physical Exam (CPE) preventive care annual visit Today patient counseled on age appropriate routine health concerns for screening and prevention, each reviewed and up to date or declined. Immunizations reviewed and up to date or declined. Labs ordered and reviewed. Risk factors for depression reviewed and negative. Hearing function and visual acuity are intact. ADLs screened and addressed as needed. Functional ability and level of safety reviewed and appropriate. Education, counseling and referrals performed based on assessed risks today. Patient provided with a copy of personalized plan for preventive services.  Cough Acute onset suggests viral infection or COVID-19. Flu and COVID test negative No red flags on exam.   Currently taking prednisone  for radiculopathy -- will not add additional treatment(s) at this time Reviewed return precautions including new or worsening fever, SOB, new or worsening cough or other concerns.  Push fluids and rest.  I recommend that patient follow-up if symptoms worsen or persist despite treatment x 7-10 days, sooner if needed.  Hypertension Resumed amlodipine  and lisinopril  after a lapse. - Recheck blood pressure at the end of the visit. - Continue  amlodipine  10 mg and lisinopril -hydrochloroTHIAZIDE  20-25 mg  Type 2 diabetes mellitus Current blood sugar levels unknown. - Order blood work to assess blood sugar levels. - update prescription after blood work has returned  Hyperlipidemia Not taking medication due to running out. - Refill cholesterol medication after blood work results are back  Tobacco use disorder Resumed smoking in June.     Recommend follow up in 4 months to better monitor chronic conditions    Lucie Buttner, PA-C  [place] [facility]

## 2024-07-12 NOTE — Telephone Encounter (Signed)
 Please see message and advise dose of Ozempic .

## 2024-07-13 ENCOUNTER — Other Ambulatory Visit: Payer: Self-pay | Admitting: Physician Assistant

## 2024-07-13 MED ORDER — OZEMPIC (0.25 OR 0.5 MG/DOSE) 2 MG/3ML ~~LOC~~ SOPN: 0.2500 mg | PEN_INJECTOR | SUBCUTANEOUS | 1 refills | Status: AC

## 2024-07-20 ENCOUNTER — Other Ambulatory Visit: Payer: Self-pay | Admitting: Physician Assistant

## 2024-07-20 NOTE — Telephone Encounter (Signed)
 Please see message and advise

## 2024-07-23 NOTE — Telephone Encounter (Signed)
 Noted

## 2024-10-05 ENCOUNTER — Encounter (HOSPITAL_COMMUNITY): Payer: Self-pay | Admitting: Emergency Medicine

## 2024-10-05 ENCOUNTER — Other Ambulatory Visit: Payer: Self-pay

## 2024-10-05 ENCOUNTER — Emergency Department (HOSPITAL_COMMUNITY)
Admission: EM | Admit: 2024-10-05 | Discharge: 2024-10-05 | Disposition: A | Discharge status: Home / Self Care | Payer: Self-pay | Attending: Emergency Medicine | Admitting: Emergency Medicine

## 2024-10-05 DIAGNOSIS — Z79899 Other long term (current) drug therapy: Secondary | ICD-10-CM | POA: Insufficient documentation

## 2024-10-05 DIAGNOSIS — I10 Essential (primary) hypertension: Secondary | ICD-10-CM | POA: Insufficient documentation

## 2024-10-05 DIAGNOSIS — E119 Type 2 diabetes mellitus without complications: Secondary | ICD-10-CM | POA: Insufficient documentation

## 2024-10-05 DIAGNOSIS — M5441 Lumbago with sciatica, right side: Secondary | ICD-10-CM | POA: Insufficient documentation

## 2024-10-05 DIAGNOSIS — F17200 Nicotine dependence, unspecified, uncomplicated: Secondary | ICD-10-CM | POA: Insufficient documentation

## 2024-10-05 DIAGNOSIS — Z7984 Long term (current) use of oral hypoglycemic drugs: Secondary | ICD-10-CM | POA: Insufficient documentation

## 2024-10-05 MED ORDER — METHYLPREDNISOLONE 4 MG PO TBPK: ORAL_TABLET | ORAL | 0 refills | Status: AC

## 2024-10-05 MED ORDER — DEXAMETHASONE SOD PHOSPHATE PF 10 MG/ML IJ SOLN
10.0000 mg | Freq: Once | INTRAMUSCULAR | Status: AC
  Administered 2024-10-05: 10 mg via INTRAMUSCULAR
  Filled 2024-10-05: qty 1

## 2024-10-05 MED ORDER — KETOROLAC TROMETHAMINE 15 MG/ML IJ SOLN
15.0000 mg | Freq: Once | INTRAMUSCULAR | Status: AC
  Administered 2024-10-05: 15 mg via INTRAMUSCULAR
  Filled 2024-10-05: qty 1

## 2024-10-05 NOTE — ED Triage Notes (Signed)
 Pt reports that he is having right leg pain that moves into his lower back for about a week.  Pt states sometimes it feels as if it is tingling or as if his phone is vibrating on his leg.  He has been told in the past that he has a pinched nerve.

## 2024-10-05 NOTE — Discharge Instructions (Addendum)
 Your pain is consistent with sciatica.  Please take the prescribed steroids.  You may also take anti-inflammatory medication at home such as Advil  or Naprosyn .  Monitor your blood glucose as the steroids may cause temporary increases in those levels.  Return to the emergency department if you develop any life-threatening symptoms, otherwise please follow-up with your primary care provider.

## 2024-10-05 NOTE — ED Provider Notes (Signed)
 " [place] EMERGENCY DEPARTMENT AT [facility] Provider Note   CSN: [id number] Arrival date & time: 10/05/24  9947     Patient presents with: Leg Pain   Robert Pratt is a 54 y.o. male.  Patient with past medical history significant for hypertension, obesity, type II DM presents to the emergency department complaining of right sided low back pain with radiation into the right leg.  This been ongoing for approximately 1 week.  He describes the pain as a burning pain.  He states he has had similar pain in the past and was told he had a pinched nerve.  He denies red flag symptoms such as saddle anesthesia, urinary incontinence, fecal incontinence.  He reports no trauma    Leg Pain      Prior to Admission medications  Medication Sig Start Date End Date Taking? Authorizing Provider  methylPREDNISolone  (MEDROL  DOSEPAK) 4 MG TBPK tablet Take as directed per package instructions 10/05/24  Yes Logan Ubaldo KATHEE, PA-C  amLODipine  (NORVASC ) 10 MG tablet Take 1 tablet (10 mg total) by mouth daily. 07/29/21   Job Lukes, PA  atorvastatin  (LIPITOR) 40 MG tablet Take 1 tablet (40 mg total) by mouth daily. 07/10/24   Job Lukes, PA  lisinopril -hydrochlorothiazide  (ZESTORETIC ) 20-25 MG tablet TAKE 1 TABLET BY MOUTH EVERY DAY 05/17/22   Job Lukes, PA  metFORMIN  (GLUCOPHAGE -XR) 750 MG 24 hr tablet Take 1 tablet daily x 2 weeks, then may increase to two tablets daily if tolerated. 07/10/24   Job Lukes, PA  Semaglutide ,0.25 or 0.5MG /DOS, (OZEMPIC , 0.25 OR 0.5 MG/DOSE,) 2 MG/3ML SOPN Inject 0.25 mg into the skin once a week. 07/13/24   Job Lukes, PA    Allergies: No known allergies    Review of Systems  Updated Vital Signs BP (!) 145/93 (BP Location: Right Arm)   Pulse 67   Temp 98.1 F (36.7 C) (Oral)   Resp 18   Ht 5' 7 (1.702 m)   Wt 104.3 kg   SpO2 100%   BMI 36.01 kg/m   Physical Exam Vitals and nursing note reviewed.  HENT:     Head:  Normocephalic and atraumatic.  Eyes:     Pupils: Pupils are equal, round, and reactive to light.  Pulmonary:     Effort: Pulmonary effort is normal. No respiratory distress.  Musculoskeletal:        General: No tenderness or signs of injury. Normal range of motion.     Cervical back: Normal range of motion.     Comments: Grossly normal range of motion of the right leg.  No tenderness to palpation of the low back or leg.  Sensation intact  Skin:    General: Skin is dry.  Neurological:     Mental Status: He is alert.  Psychiatric:        Speech: Speech normal.        Behavior: Behavior normal.     (all labs ordered are listed, but only abnormal results are displayed) Labs Reviewed - No data to display  EKG: None  Radiology: No results found.   Procedures   Medications Ordered in the ED  ketorolac  (TORADOL ) 15 MG/ML injection 15 mg (15 mg Intramuscular Given 10/05/24 0255)  dexamethasone  (DECADRON ) injection 10 mg (10 mg Intramuscular Given 10/05/24 0255)  Medical Decision Making Risk Prescription drug management.   This patient presents to the ED for concern of back pain, this involves an extensive number of treatment options, and is a complaint that carries with it a high risk of complications and morbidity.  The differential diagnosis includes sciatica, fracture dislocation, cauda equina, others   Co morbidities / Chronic conditions that complicate the patient evaluation  As noted in HPI   Additional history obtained:  Additional history obtained from EMR   Imaging Studies ordered:  No red flag symptoms that would require emergent spinal imaging at this time  Problem List / ED Course / Critical interventions / Medication management   I ordered medication including Toradol  and Decadron  Reevaluation of the patient after these medicines showed that the patient improved I have reviewed the patients home medicines and have  made adjustments as needed    Social Determinants of Health:  Patient is a daily smoker   Test / Admission - Considered:  Patient with physical exam consistent with back pain with sciatica.  No red flag symptoms to suggest cauda equina at this time.  No injury.  No tenderness on examination.  Patient appears stable for discharge home.  Plan to prescribe Medrol  Dosepak and have patient follow-up with primary care.  Patient advised to monitor blood glucose and warned that steroids may increase his glucose levels. Return precautions provided.       Final diagnoses:  Acute bilateral low back pain with right-sided sciatica    ED Discharge Orders          Ordered    methylPREDNISolone  (MEDROL  DOSEPAK) 4 MG TBPK tablet        10/05/24 0326               Logan Ubaldo NOVAK, PA-C 10/05/24 0327    Emil Share, DO 10/05/24 9667  "

## 2024-10-15 ENCOUNTER — Encounter (HOSPITAL_BASED_OUTPATIENT_CLINIC_OR_DEPARTMENT_OTHER): Payer: Self-pay

## 2024-10-15 ENCOUNTER — Emergency Department (HOSPITAL_BASED_OUTPATIENT_CLINIC_OR_DEPARTMENT_OTHER): Payer: Self-pay

## 2024-10-15 ENCOUNTER — Other Ambulatory Visit: Payer: Self-pay

## 2024-10-15 ENCOUNTER — Emergency Department (HOSPITAL_BASED_OUTPATIENT_CLINIC_OR_DEPARTMENT_OTHER)
Admission: EM | Admit: 2024-10-15 | Discharge: 2024-10-15 | Disposition: A | Discharge status: Home / Self Care | Payer: Self-pay | Attending: Emergency Medicine | Admitting: Emergency Medicine

## 2024-10-15 DIAGNOSIS — M1711 Unilateral primary osteoarthritis, right knee: Secondary | ICD-10-CM | POA: Insufficient documentation

## 2024-10-15 MED ORDER — DICLOFENAC SODIUM 75 MG PO TBEC: 75.0000 mg | DELAYED_RELEASE_TABLET | Freq: Two times a day (BID) | ORAL | 0 refills | Status: AC

## 2024-10-15 MED ORDER — CYCLOBENZAPRINE HCL 10 MG PO TABS: 10.0000 mg | ORAL_TABLET | Freq: Three times a day (TID) | ORAL | 0 refills | Status: AC | PRN

## 2024-10-15 NOTE — ED Provider Notes (Signed)
 " [place] EMERGENCY DEPARTMENT AT [facility] Provider Note   CSN: [id number] Arrival date & time: 10/15/24  1926     Patient presents with: Leg Pain   Robert Pratt is a 54 y.o. male.   Pt complains of right leg pain and swelling.  Pt reports he recently rolled off of an air mattress and hit right side of leg.  Pt complains of pain in his knee as well.  Pt seen 1/9 and was given prednisone .  Pt reports no relief from pain. Pt reports he has had knee problems in the past.   The history is provided by the patient. No language interpreter was used.  Leg Pain Location:  Knee Time since incident:  2 weeks Injury: yes   Knee location:  R knee Pain details:    Quality:  Aching   Severity:  Moderate   Onset quality:  Gradual   Timing:  Constant   Progression:  Worsening Dislocation: no   Foreign body present:  No foreign bodies Relieved by:  Nothing Worsened by:  Nothing      Prior to Admission medications  Medication Sig Start Date End Date Taking? Authorizing Provider  amLODipine  (NORVASC ) 10 MG tablet Take 1 tablet (10 mg total) by mouth daily. 07/29/21   Job Lukes, PA  atorvastatin  (LIPITOR) 40 MG tablet Take 1 tablet (40 mg total) by mouth daily. 07/10/24   Job Lukes, PA  lisinopril -hydrochlorothiazide  (ZESTORETIC ) 20-25 MG tablet TAKE 1 TABLET BY MOUTH EVERY DAY 05/17/22   Job Lukes, PA  metFORMIN  (GLUCOPHAGE -XR) 750 MG 24 hr tablet Take 1 tablet daily x 2 weeks, then may increase to two tablets daily if tolerated. 07/10/24   Job Lukes, PA  methylPREDNISolone  (MEDROL  DOSEPAK) 4 MG TBPK tablet Take as directed per package instructions 10/05/24   Logan Ubaldo KATHEE, PA-C  Semaglutide ,0.25 or 0.5MG /DOS, (OZEMPIC , 0.25 OR 0.5 MG/DOSE,) 2 MG/3ML SOPN Inject 0.25 mg into the skin once a week. 07/13/24   Job Lukes, PA    Allergies: No known allergies    Review of Systems  All other systems reviewed and are negative.   Updated  Vital Signs BP (!) 143/78 (BP Location: Right Arm)   Pulse 89   Temp 98 F (36.7 C) (Oral)   Resp 20   Ht 5' 7 (1.702 m)   Wt 104.3 kg   SpO2 100%   BMI 36.01 kg/m   Physical Exam Vitals and nursing note reviewed.  Constitutional:      Appearance: He is well-developed.  HENT:     Head: Normocephalic.     Mouth/Throat:     Mouth: Mucous membranes are moist.  Eyes:     Pupils: Pupils are equal, round, and reactive to light.  Cardiovascular:     Rate and Rhythm: Normal rate.  Pulmonary:     Effort: Pulmonary effort is normal.  Abdominal:     General: There is no distension.  Musculoskeletal:        General: Swelling and tenderness present.     Cervical back: Normal range of motion.     Comments: Tender right knee, swollen supra patella area. Tender right sciatic area, from nv and ns intact   Skin:    General: Skin is warm.  Neurological:     General: No focal deficit present.     Mental Status: He is alert and oriented to person, place, and time.  Psychiatric:        Mood and Affect: Mood  normal.     (all labs ordered are listed, but only abnormal results are displayed) Labs Reviewed - No data to display  EKG: None  Radiology: DG Knee Complete 4 Views Right Result Date: 10/15/2024 CLINICAL DATA:  Swelling. EXAM: RIGHT FEMUR 2 VIEWS; RIGHT KNEE - COMPLETE 4+ VIEW COMPARISON:  Right knee radiograph dated 03/02/2022. FINDINGS: There is no acute fracture or dislocation. The bones are well mineralized. Mild arthritic changes of the right knee. Similar appearance of a bipartite patella. No joint effusion. The soft tissues are unremarkable. IMPRESSION: 1. No acute fracture or dislocation. 2. Mild arthritic changes of the right knee. Electronically Signed   By: Vanetta Chou M.D.   On: 10/15/2024 20:50   DG Femur Min 2 Views Right Result Date: 10/15/2024 CLINICAL DATA:  Swelling. EXAM: RIGHT FEMUR 2 VIEWS; RIGHT KNEE - COMPLETE 4+ VIEW COMPARISON:  Right knee radiograph  dated 03/02/2022. FINDINGS: There is no acute fracture or dislocation. The bones are well mineralized. Mild arthritic changes of the right knee. Similar appearance of a bipartite patella. No joint effusion. The soft tissues are unremarkable. IMPRESSION: 1. No acute fracture or dislocation. 2. Mild arthritic changes of the right knee. Electronically Signed   By: Vanetta Chou M.D.   On: 10/15/2024 20:50     Procedures   Medications Ordered in the ED - No data to display                                  Medical Decision Making Pt complains of pain in his right knee and right buttock.    Amount and/or Complexity of Data Reviewed Radiology: ordered and independent interpretation performed. Decision-making details documented in ED Course.    Details: Xray right knee and right femur shows arthritic changes.         Final diagnoses:  Primary osteoarthritis of right knee    ED Discharge Orders          Ordered    diclofenac  (VOLTAREN ) 75 MG EC tablet  2 times daily        10/15/24 2145    cyclobenzaprine  (FLEXERIL ) 10 MG tablet  3 times daily PRN        10/15/24 2145           An After Visit Summary was printed and given to the patient.     Flint Sonny POUR, PA-C 10/15/24 2145  "

## 2024-10-15 NOTE — Discharge Instructions (Addendum)
Schedule to see the Orthopaedist for recheck.  Return if any problems.  ?

## 2024-10-15 NOTE — ED Triage Notes (Signed)
 Patient here POV from Home.  Endorses Right upper leg and knee pain for about 1 week. Radiates to Right lower back. Sharp. States pain became worse after he was moving furniture last week. States he wasn't drinking much water during this time.   NAD noted during triage. A&Ox4. GCS 15. Ambulatory.

## 2024-11-16 ENCOUNTER — Ambulatory Visit: Admitting: Physician Assistant
